# Patient Record
Sex: Female | Born: 1935 | Race: White | Hispanic: No | Smoking: Never smoker
Health system: Southern US, Community
[De-identification: ages and names within clinical notes are randomized; demographics above are authoritative.]

## PROBLEM LIST (undated history)

## (undated) DIAGNOSIS — E785 Hyperlipidemia, unspecified: Secondary | ICD-10-CM

## (undated) DIAGNOSIS — K219 Gastro-esophageal reflux disease without esophagitis: Secondary | ICD-10-CM

## (undated) DIAGNOSIS — E079 Disorder of thyroid, unspecified: Secondary | ICD-10-CM

## (undated) DIAGNOSIS — I509 Heart failure, unspecified: Secondary | ICD-10-CM

## (undated) DIAGNOSIS — I10 Essential (primary) hypertension: Secondary | ICD-10-CM

## (undated) DIAGNOSIS — F329 Major depressive disorder, single episode, unspecified: Secondary | ICD-10-CM

## (undated) DIAGNOSIS — F32A Depression, unspecified: Secondary | ICD-10-CM

## (undated) HISTORY — DX: Gastro-esophageal reflux disease without esophagitis: K21.9

## (undated) HISTORY — DX: Disorder of thyroid, unspecified: E07.9

## (undated) HISTORY — DX: Major depressive disorder, single episode, unspecified: F32.9

## (undated) HISTORY — DX: Depression, unspecified: F32.A

## (undated) HISTORY — PX: ANGIOPLASTY: SHX39

## (undated) HISTORY — PX: OTHER SURGICAL HISTORY: SHX169

## (undated) HISTORY — DX: Heart failure, unspecified: I50.9

## (undated) HISTORY — DX: Hyperlipidemia, unspecified: E78.5

## (undated) HISTORY — DX: Essential (primary) hypertension: I10

## (undated) HISTORY — PX: CRANIOTOMY: SHX93

---

## 1978-04-14 HISTORY — PX: ABDOMINAL HYSTERECTOMY: SHX81

## 1995-02-26 HISTORY — PX: MASTECTOMY: SHX3

## 2000-06-17 ENCOUNTER — Inpatient Hospital Stay (HOSPITAL_COMMUNITY): Admission: AD | Admit: 2000-06-17 | Discharge: 2000-06-21 | Payer: Self-pay | Admitting: Cardiology

## 2003-07-26 ENCOUNTER — Other Ambulatory Visit: Payer: Self-pay

## 2004-02-19 ENCOUNTER — Ambulatory Visit: Payer: Self-pay | Admitting: General Surgery

## 2004-04-18 ENCOUNTER — Ambulatory Visit: Payer: Self-pay | Admitting: Unknown Physician Specialty

## 2004-09-30 ENCOUNTER — Ambulatory Visit: Payer: Self-pay | Admitting: Unknown Physician Specialty

## 2005-02-17 ENCOUNTER — Ambulatory Visit: Payer: Self-pay | Admitting: General Surgery

## 2005-04-11 ENCOUNTER — Ambulatory Visit: Payer: Self-pay | Admitting: Ophthalmology

## 2005-04-21 ENCOUNTER — Ambulatory Visit: Payer: Medicare (Managed Care) | Admitting: Ophthalmology

## 2006-02-23 ENCOUNTER — Ambulatory Visit: Payer: Self-pay | Admitting: General Surgery

## 2007-03-15 ENCOUNTER — Ambulatory Visit: Payer: Self-pay | Admitting: General Surgery

## 2008-02-01 ENCOUNTER — Ambulatory Visit: Payer: Self-pay | Admitting: *Deleted

## 2008-05-10 ENCOUNTER — Ambulatory Visit: Payer: Self-pay | Admitting: Family Medicine

## 2008-09-16 ENCOUNTER — Inpatient Hospital Stay: Payer: Self-pay | Admitting: Internal Medicine

## 2008-09-19 LAB — HM COLONOSCOPY

## 2008-11-01 ENCOUNTER — Ambulatory Visit: Payer: Self-pay | Admitting: Gastroenterology

## 2008-11-05 ENCOUNTER — Emergency Department: Payer: Self-pay | Admitting: Emergency Medicine

## 2009-05-11 ENCOUNTER — Ambulatory Visit: Payer: Self-pay | Admitting: Family Medicine

## 2010-02-01 IMAGING — RF DG SMALL BOWEL
1 series · 9 of 9 positions shown · non-contrast
Comparison: none

REASON FOR EXAM: gi bleed
COMMENTS:

[Series 1: run · 9 of 9 slices shown]
[im 1/9]
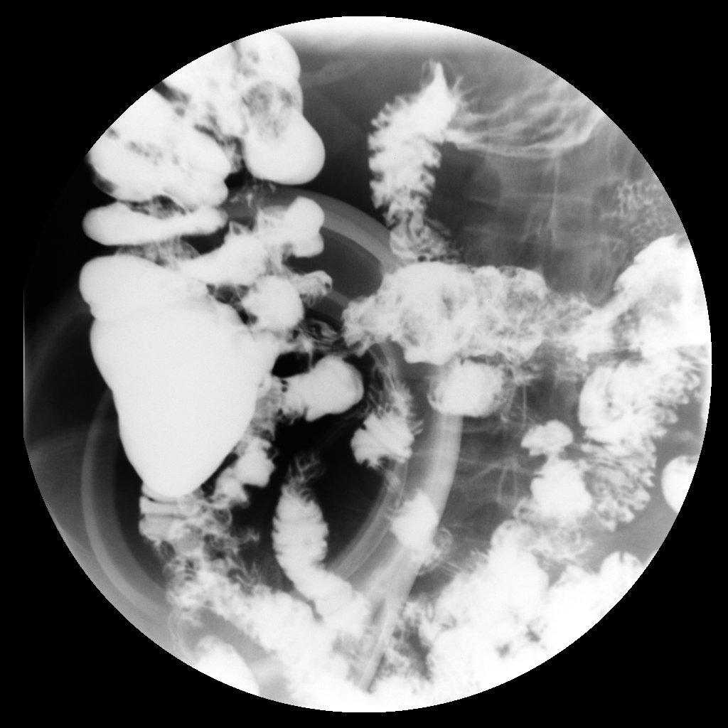
[im 2/9]
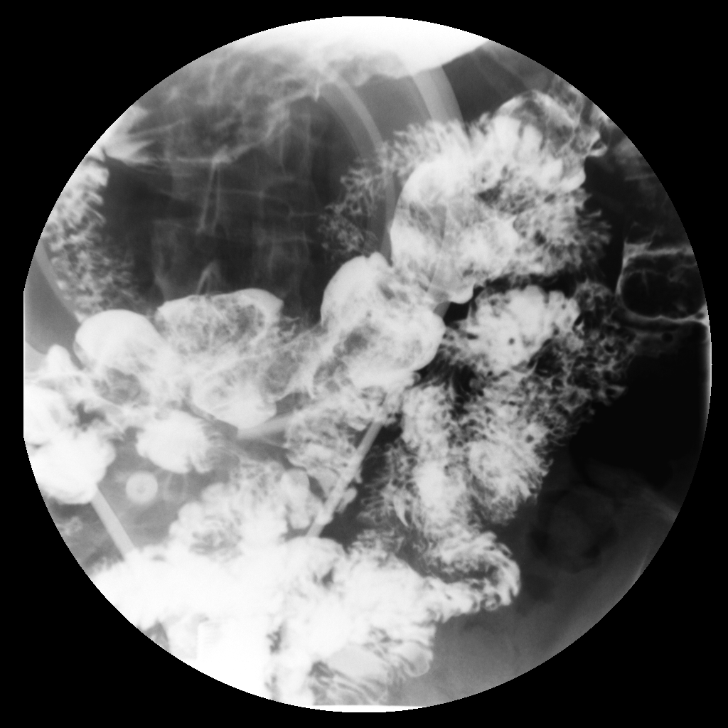
[im 3/9]
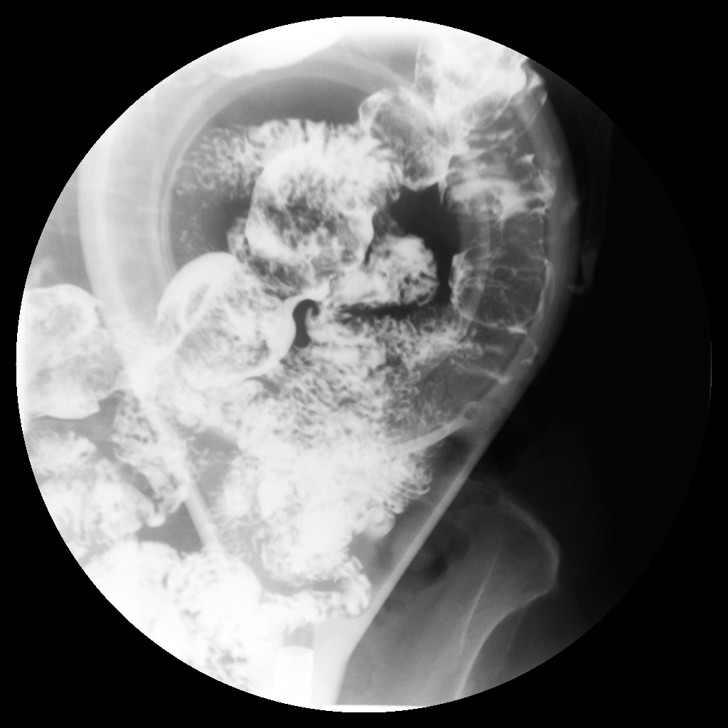
[im 4/9]
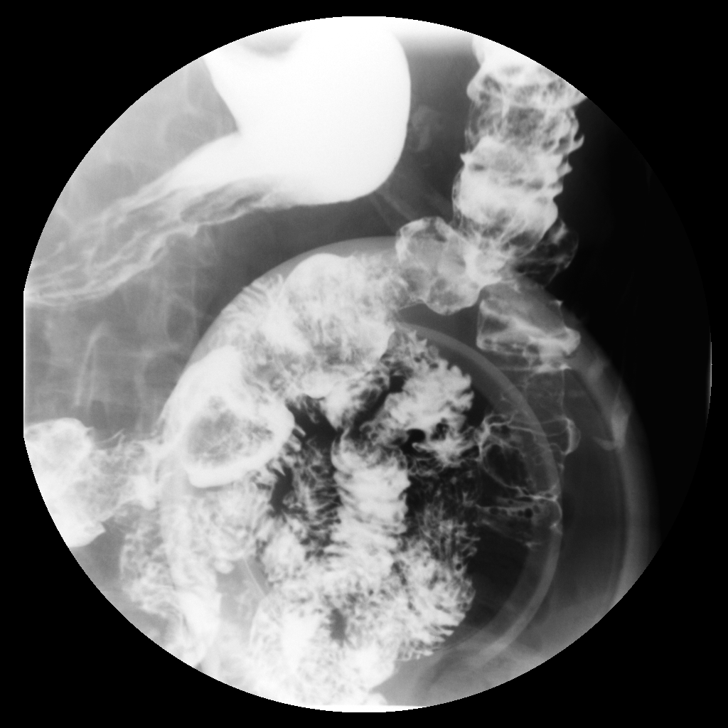
[im 5/9]
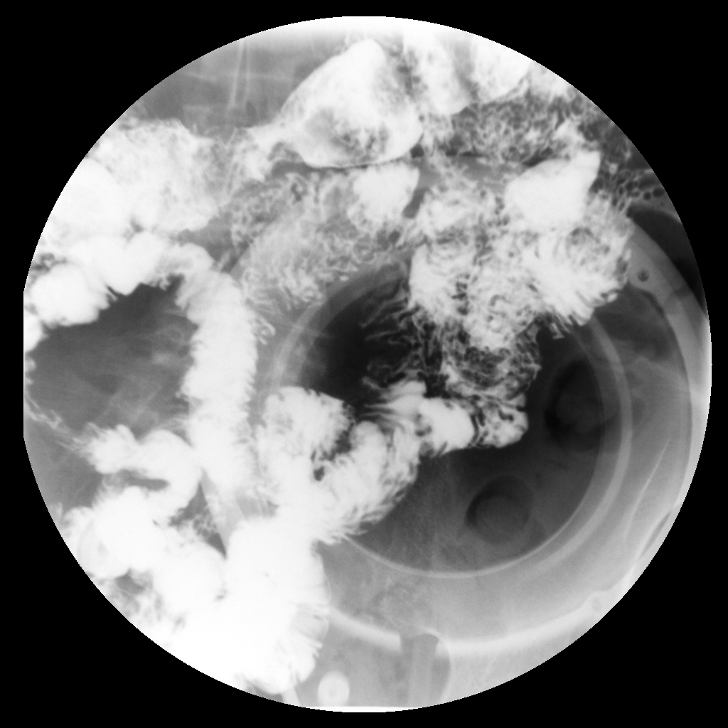
[im 6/9]
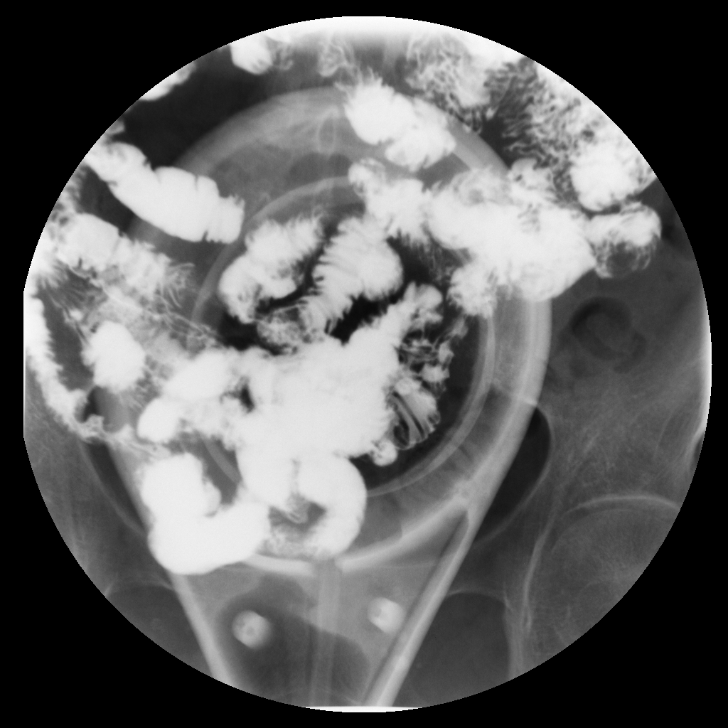
[im 7/9]
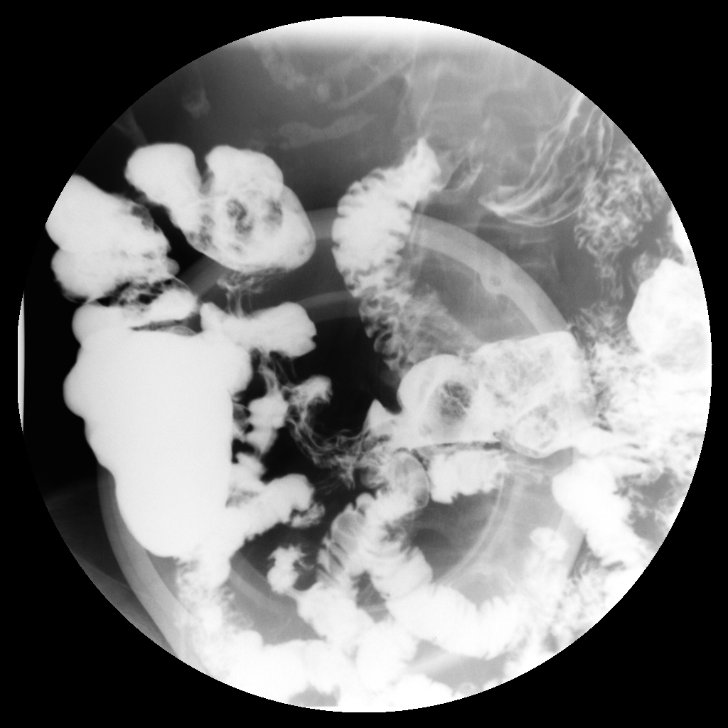
[im 8/9]
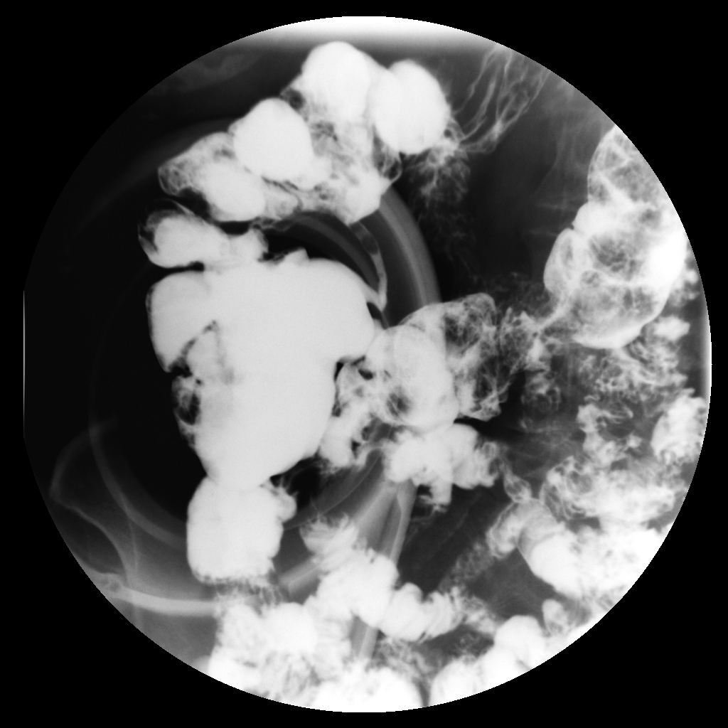
[im 9/9]
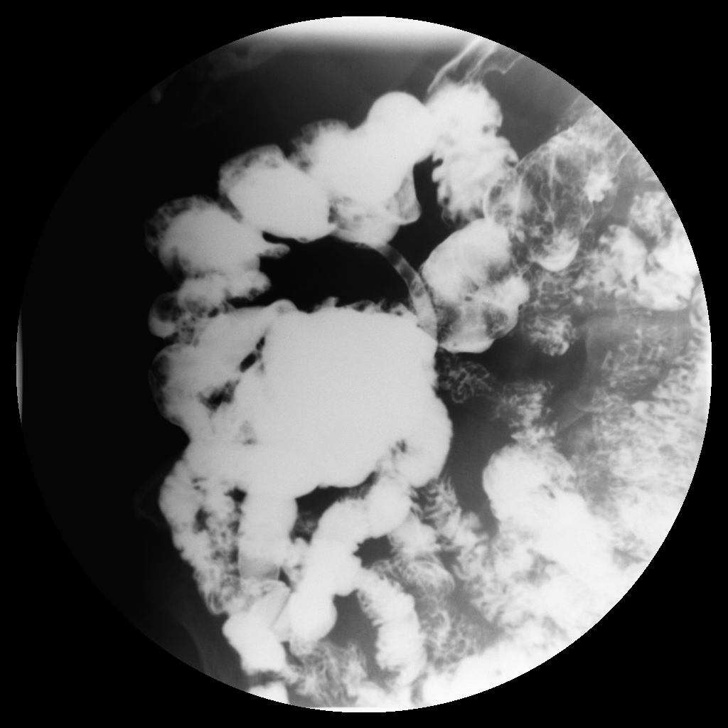

[9 of 9 positions shown; findings below may reference images not displayed]

PROCEDURE:     FL  - FL SMALL BOWEL  - November 01, 2008 [DATE]

RESULT:     The patient was given oral barium and the barium column was
followed through the small bowel.

The duodenum demonstrates appropriate rotation and placement of the ligament
of Treitz. Transit time to the terminal ileum is approximately 15 minutes.
Evaluation of the small bowel demonstrates no radiographic abnormalities.
The terminal ileum is partially appreciated and demonstrates no gross
radiographic abnormalities. Specifically, there is no evidence of filling
defects, focal out-pouchings or fusiform dilatation within the small bowel.
IMPRESSION: Unremarkable small bowel follow-through as described above.

## 2010-06-24 ENCOUNTER — Ambulatory Visit: Payer: Self-pay | Admitting: Family Medicine

## 2011-02-19 ENCOUNTER — Other Ambulatory Visit: Payer: Self-pay | Admitting: Family Medicine

## 2011-10-13 ENCOUNTER — Ambulatory Visit: Payer: Self-pay | Admitting: Family Medicine

## 2013-02-28 ENCOUNTER — Ambulatory Visit: Payer: Self-pay | Admitting: Family Medicine

## 2013-08-17 ENCOUNTER — Ambulatory Visit: Payer: Self-pay | Admitting: Family Medicine

## 2013-09-12 DIAGNOSIS — I219 Acute myocardial infarction, unspecified: Secondary | ICD-10-CM | POA: Insufficient documentation

## 2013-09-12 DIAGNOSIS — D496 Neoplasm of unspecified behavior of brain: Secondary | ICD-10-CM | POA: Insufficient documentation

## 2013-09-12 DIAGNOSIS — Z9889 Other specified postprocedural states: Secondary | ICD-10-CM | POA: Insufficient documentation

## 2013-11-17 LAB — HM DEXA SCAN

## 2013-11-18 LAB — CBC AND DIFFERENTIAL
HCT: 40 % (ref 36–46)
Hemoglobin: 12.9 g/dL (ref 12.0–16.0)
PLATELETS: 282 10*3/uL (ref 150–399)
WBC: 6.2 10*3/mL

## 2013-11-18 LAB — BASIC METABOLIC PANEL
BUN: 18 mg/dL (ref 4–21)
Creatinine: 1.1 mg/dL (ref 0.5–1.1)
Glucose: 91 mg/dL
POTASSIUM: 4.5 mmol/L (ref 3.4–5.3)
Sodium: 143 mmol/L (ref 137–147)

## 2013-11-18 LAB — LIPID PANEL
CHOLESTEROL: 176 mg/dL (ref 0–200)
HDL: 47 mg/dL (ref 35–70)
LDL Cholesterol: 92 mg/dL
TRIGLYCERIDES: 186 mg/dL — AB (ref 40–160)

## 2013-11-18 LAB — HEPATIC FUNCTION PANEL
ALT: 10 U/L (ref 7–35)
AST: 9 U/L — AB (ref 13–35)

## 2014-01-02 DIAGNOSIS — M810 Age-related osteoporosis without current pathological fracture: Secondary | ICD-10-CM | POA: Insufficient documentation

## 2014-05-25 ENCOUNTER — Ambulatory Visit: Payer: Self-pay | Admitting: Family Medicine

## 2014-05-25 LAB — HM MAMMOGRAPHY

## 2014-10-13 DIAGNOSIS — C50919 Malignant neoplasm of unspecified site of unspecified female breast: Secondary | ICD-10-CM | POA: Insufficient documentation

## 2014-10-13 DIAGNOSIS — F0390 Unspecified dementia without behavioral disturbance: Secondary | ICD-10-CM | POA: Insufficient documentation

## 2014-10-13 DIAGNOSIS — K219 Gastro-esophageal reflux disease without esophagitis: Secondary | ICD-10-CM | POA: Insufficient documentation

## 2014-10-13 DIAGNOSIS — N183 Chronic kidney disease, stage 3 unspecified: Secondary | ICD-10-CM | POA: Insufficient documentation

## 2014-10-13 DIAGNOSIS — K922 Gastrointestinal hemorrhage, unspecified: Secondary | ICD-10-CM | POA: Insufficient documentation

## 2014-10-13 DIAGNOSIS — I509 Heart failure, unspecified: Secondary | ICD-10-CM | POA: Insufficient documentation

## 2014-10-13 DIAGNOSIS — I10 Essential (primary) hypertension: Secondary | ICD-10-CM | POA: Insufficient documentation

## 2014-10-13 DIAGNOSIS — E538 Deficiency of other specified B group vitamins: Secondary | ICD-10-CM | POA: Insufficient documentation

## 2014-10-13 DIAGNOSIS — E559 Vitamin D deficiency, unspecified: Secondary | ICD-10-CM | POA: Insufficient documentation

## 2014-10-13 DIAGNOSIS — E876 Hypokalemia: Secondary | ICD-10-CM | POA: Insufficient documentation

## 2014-10-13 DIAGNOSIS — F32A Depression, unspecified: Secondary | ICD-10-CM | POA: Insufficient documentation

## 2014-10-13 DIAGNOSIS — K5790 Diverticulosis of intestine, part unspecified, without perforation or abscess without bleeding: Secondary | ICD-10-CM | POA: Insufficient documentation

## 2014-10-13 DIAGNOSIS — F329 Major depressive disorder, single episode, unspecified: Secondary | ICD-10-CM | POA: Insufficient documentation

## 2014-10-13 DIAGNOSIS — E7800 Pure hypercholesterolemia, unspecified: Secondary | ICD-10-CM | POA: Insufficient documentation

## 2014-10-13 DIAGNOSIS — R0902 Hypoxemia: Secondary | ICD-10-CM | POA: Insufficient documentation

## 2014-10-13 DIAGNOSIS — I2510 Atherosclerotic heart disease of native coronary artery without angina pectoris: Secondary | ICD-10-CM | POA: Insufficient documentation

## 2014-10-13 DIAGNOSIS — K449 Diaphragmatic hernia without obstruction or gangrene: Secondary | ICD-10-CM | POA: Insufficient documentation

## 2014-10-13 DIAGNOSIS — E039 Hypothyroidism, unspecified: Secondary | ICD-10-CM | POA: Insufficient documentation

## 2014-10-13 DIAGNOSIS — F419 Anxiety disorder, unspecified: Secondary | ICD-10-CM | POA: Insufficient documentation

## 2014-10-13 DIAGNOSIS — M810 Age-related osteoporosis without current pathological fracture: Secondary | ICD-10-CM | POA: Insufficient documentation

## 2014-11-07 ENCOUNTER — Ambulatory Visit: Payer: Self-pay | Admitting: Family Medicine

## 2014-11-07 ENCOUNTER — Other Ambulatory Visit: Payer: Self-pay

## 2014-11-07 DIAGNOSIS — E876 Hypokalemia: Secondary | ICD-10-CM

## 2014-11-07 MED ORDER — POTASSIUM CHLORIDE ER 10 MEQ PO CPCR: 10.0000 meq | ORAL_CAPSULE | Freq: Every day | ORAL | Status: DC

## 2014-11-20 ENCOUNTER — Ambulatory Visit (INDEPENDENT_AMBULATORY_CARE_PROVIDER_SITE_OTHER): Payer: Medicare Other | Admitting: Family Medicine

## 2014-11-20 ENCOUNTER — Encounter: Payer: Self-pay | Admitting: Family Medicine

## 2014-11-20 VITALS — BP 124/64 | HR 64 | Temp 98.0°F | Resp 16 | Wt 126.0 lb

## 2014-11-20 DIAGNOSIS — I10 Essential (primary) hypertension: Secondary | ICD-10-CM

## 2014-11-20 DIAGNOSIS — E559 Vitamin D deficiency, unspecified: Secondary | ICD-10-CM | POA: Diagnosis not present

## 2014-11-20 DIAGNOSIS — E039 Hypothyroidism, unspecified: Secondary | ICD-10-CM | POA: Diagnosis not present

## 2014-11-20 DIAGNOSIS — N183 Chronic kidney disease, stage 3 unspecified: Secondary | ICD-10-CM

## 2014-11-20 DIAGNOSIS — E538 Deficiency of other specified B group vitamins: Secondary | ICD-10-CM

## 2014-11-20 DIAGNOSIS — E7800 Pure hypercholesterolemia, unspecified: Secondary | ICD-10-CM

## 2014-11-20 NOTE — Progress Notes (Signed)
Subjective:    Patient ID: Holly Burns, female    DOB: Nov 05, 1924, 81 y.o.   MRN: [id number]  Hypertension This is a chronic problem. The problem is unchanged. The problem is controlled. Pertinent negatives include no anxiety, blurred vision, chest pain, headaches, malaise/fatigue, neck pain, palpitations, peripheral edema, PND, shortness of breath or sweats. Risk factors for coronary artery disease include dyslipidemia. There are no compliance problems.   Hyperlipidemia This is a chronic problem. The problem is controlled. Recent lipid tests were reviewed and are normal. There are no known factors aggravating her hyperlipidemia. Pertinent negatives include no chest pain, myalgias or shortness of breath. Current antihyperlipidemic treatment includes statins. There are no compliance problems.  Risk factors for coronary artery disease include hypertension and dyslipidemia.   Lab Results  Component Value Date   CHOL 176 11/18/2013   HDL 47 11/18/2013   LDLCALC 92 11/18/2013   TRIG 186* 11/18/2013     Hypothyroidism: Patient presents for evaluation of thyroid function. Symptoms consist of denies fatigue, weight changes, heat/cold intolerance, bowel/skin changes or CVS symptoms. The problem has been stable.  Previous thyroid studies include TSH. The hypothyroidism is due to hypothyroidism.        Patient Active Problem List   Diagnosis Date Noted  . Anxiety 10/13/2014  . B12 deficiency 10/13/2014  . Breast CA 10/13/2014  . Atherosclerosis of coronary artery 10/13/2014  . Chronic kidney disease (CKD), stage III (moderate) 10/13/2014  . CCF (congestive cardiac failure) 10/13/2014  . Clinical depression 10/13/2014  . DD (diverticular disease) 10/13/2014  . Bleeding gastrointestinal 10/13/2014  . Acid reflux 10/13/2014  . 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 Hypercholesteremia 10/13/2014  . BP (high blood pressure) 10/13/2014  . Decreased potassium in the blood 10/13/2014  .  Adult hypothyroidism 10/13/2014  . Hypoxia 10/13/2014  . OP (osteoporosis) 10/13/2014  . Dementia 10/13/2014  . Avitaminosis D 10/13/2014  . Involutional osteoporosis 01/02/2014  . Heart attack 09/12/2013  . H/O cardiac catheterization 09/12/2013  . Brain tumor 09/12/2013   Family History  Problem Relation Age of Onset  . Hypertension Mother   . Cerebrovascular Accident Mother   . Heart disease Father    History   Social History  . Marital Status: Widowed    Spouse Name: N/A  . Number of Children: 2  . Years of Education: H/S   Occupational History  . Retired    Social History Main Topics  . Smoking status: Never Smoker   . Smokeless tobacco: Never Used  . Alcohol Use: No  . Drug Use: No  . Sexual Activity: Not on file   Other Topics Concern  . Not on file   Social History Narrative   Past Surgical History  Procedure Laterality Date  . Angioplasty      due to MI LV ef-40%  . 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 Craniotomy    . Abdominal hysterectomy  1980  . Cataract surgery Left    Allergies  Allergen Reactions  . Codeine   . Penicillins   . Phenytoin Sodium Extended     Hyperthermia   Previous Medications   ACETAMINOPHEN (TYLENOL) 500 MG TABLET    Take by mouth.   AMLODIPINE (NORVASC) 5 MG TABLET    Take by mouth.   CALCIUM CARBONATE (OS-CAL) 600 MG TABS TABLET    Take by mouth.   CHOLECALCIFEROL (VITAMIN D HIGH POTENCY) 1000  UNITS CAPSULE    Take by mouth.   CRANBERRY-VITAMIN C-INULIN (UTI-STAT) LIQD    Take by mouth.   DONEPEZIL (ARICEPT) 5 MG TABLET    Take by mouth.   ESCITALOPRAM (LEXAPRO) 10 MG TABLET    Take by mouth.   FERROUS SULFATE (IRON) 325 (65 FE) MG TABS    Take by mouth.   FLUVASTATIN (LESCOL) 40 MG CAPSULE    Take by mouth.   FUROSEMIDE (LASIX) 20 MG TABLET    Take by mouth.   GLUCOSAMINE 500 MG CAPS    Take by mouth.   LEVOTHYROXINE (SYNTHROID) 75 MCG TABLET    Take  by mouth.   LORATADINE (CLARITIN) 10 MG CAPS    Take by mouth.   LORAZEPAM (ATIVAN) 1 MG TABLET    Take by mouth.   MELOXICAM (MOBIC) 7.5 MG TABLET    Take by mouth.   MEMANTINE (NAMENDA) 10 MG TABLET    Take by mouth.   METOPROLOL SUCCINATE (TOPROL-XL) 25 MG 24 HR TABLET    Take by mouth.   MULTIPLE VITAMIN PO    Take by mouth.   OMEPRAZOLE (PRILOSEC) 20 MG CAPSULE    Take by mouth.   POTASSIUM CHLORIDE (MICRO-K) 10 MEQ CR CAPSULE    Take 1 capsule (10 mEq total) by mouth daily.   RAMIPRIL (ALTACE) 10 MG CAPSULE    Take by mouth.   SIMVASTATIN (ZOCOR) 20 MG TABLET    Take by mouth.   VITAMIN C (ASCORBIC ACID) 500 MG TABLET    Take by mouth.      Review of Systems  Constitutional: Negative for fever, chills, malaise/fatigue, diaphoresis, activity change, appetite change, fatigue and unexpected weight change.  Eyes: Negative for blurred vision.  Respiratory: Negative for apnea, cough, choking, chest tightness, shortness of breath, wheezing and stridor.   Cardiovascular: Negative for chest pain, palpitations, leg swelling and PND.  Gastrointestinal: Negative for nausea, vomiting, abdominal pain, diarrhea, constipation, blood in stool, abdominal distention, anal bleeding and rectal pain.  Musculoskeletal: Positive for arthralgias (Has arthritis; pt reports Tylenol is working well.  ). Negative for myalgias, back pain, joint swelling, gait problem, neck pain and neck stiffness.  Neurological: Negative for dizziness, tremors, seizures, syncope, facial asymmetry, speech difficulty, weakness, light-headedness, numbness and headaches.       Objective:   Physical Exam  Constitutional: She is oriented to person, place, and time. She appears well-developed and well-nourished.  Cardiovascular: Normal rate and regular rhythm.   Pulmonary/Chest: Effort normal and breath sounds normal.  Neurological: She is alert and oriented to person, place, and time.  Skin: Skin is warm.    BP 124/64 mmHg   Pulse 64  Temp(Src) 98 F (36.7 C) (Oral)  Resp 16  Wt 126 lb (57.153 kg)      Assessment & Plan:  1. Essential hypertension Stable. Continue current medication. Check labs.  - CBC with Differential/Platelet - Comprehensive metabolic panel  2. B12 deficiency Will check if ordered by neurology.  If not will order at [facility].    3. Hypothyroidism, unspecified hypothyroidism type Stable. Check labs.  - TSH  4. Hypercholesteremia Condition is stable. Please continue current medication and  plan of care as noted.  Will check labs.   - Lipid panel  5. Avitaminosis D Stable. Continue medication.  6. Chronic kidney disease (CKD), stage III (moderate) Stable. Check labs.   Follow up for Wellness.    Margarita Rana, MD

## 2014-11-28 ENCOUNTER — Telehealth: Payer: Self-pay

## 2014-11-28 LAB — CBC WITH DIFFERENTIAL/PLATELET
BASOS: 0 %
Basophils Absolute: 0 10*3/uL (ref 0.0–0.2)
EOS (ABSOLUTE): 0.2 10*3/uL (ref 0.0–0.4)
Eos: 2 %
HEMATOCRIT: 39.6 % (ref 34.0–46.6)
Hemoglobin: 12.8 g/dL (ref 11.1–15.9)
Immature Grans (Abs): 0 10*3/uL (ref 0.0–0.1)
Immature Granulocytes: 0 %
Lymphocytes Absolute: 2.9 10*3/uL (ref 0.7–3.1)
Lymphs: 33 %
MCH: 30.8 pg (ref 26.6–33.0)
MCHC: 32.3 g/dL (ref 31.5–35.7)
MCV: 95 fL (ref 79–97)
MONOS ABS: 0.6 10*3/uL (ref 0.1–0.9)
Monocytes: 7 %
NEUTROS PCT: 58 %
Neutrophils Absolute: 5 10*3/uL (ref 1.4–7.0)
Platelets: 294 10*3/uL (ref 150–379)
RBC: 4.16 x10E6/uL (ref 3.77–5.28)
RDW: 13.4 % (ref 12.3–15.4)
WBC: 8.8 10*3/uL (ref 3.4–10.8)

## 2014-11-28 LAB — LIPID PANEL
Chol/HDL Ratio: 4 ratio units (ref 0.0–4.4)
Cholesterol, Total: 178 mg/dL (ref 100–199)
HDL: 44 mg/dL (ref >39)
LDL Calculated: 77 mg/dL (ref 0–99)
Triglycerides: 283 mg/dL — ABNORMAL HIGH (ref 0–149)
VLDL Cholesterol Cal: 57 mg/dL — ABNORMAL HIGH (ref 5–40)

## 2014-11-28 LAB — TSH: TSH: 2.45 u[IU]/mL (ref 0.450–4.500)

## 2014-11-28 LAB — COMPREHENSIVE METABOLIC PANEL
A/G RATIO: 2 (ref 1.1–2.5)
ALBUMIN: 4.3 g/dL (ref 3.5–4.7)
ALT: 12 IU/L (ref 0–32)
AST: 12 IU/L (ref 0–40)
Alkaline Phosphatase: 36 IU/L — ABNORMAL LOW (ref 39–117)
BILIRUBIN TOTAL: 0.6 mg/dL (ref 0.0–1.2)
BUN / CREAT RATIO: 22 (ref 11–26)
BUN: 28 mg/dL — ABNORMAL HIGH (ref 8–27)
CHLORIDE: 102 mmol/L (ref 97–108)
CO2: 26 mmol/L (ref 18–29)
Calcium: 9.5 mg/dL (ref 8.7–10.3)
Creatinine, Ser: 1.3 mg/dL — ABNORMAL HIGH (ref 0.57–1.00)
GFR calc non Af Amer: 37 mL/min/{1.73_m2} — ABNORMAL LOW (ref >59)
GFR, EST AFRICAN AMERICAN: 43 mL/min/{1.73_m2} — AB (ref >59)
Globulin, Total: 2.2 g/dL (ref 1.5–4.5)
Glucose: 90 mg/dL (ref 65–99)
POTASSIUM: 5 mmol/L (ref 3.5–5.2)
Sodium: 142 mmol/L (ref 134–144)
TOTAL PROTEIN: 6.5 g/dL (ref 6.0–8.5)

## 2014-11-28 NOTE — Telephone Encounter (Signed)
Left message to call back.  Thanks, 

## 2014-11-28 NOTE — Telephone Encounter (Signed)
-----   Message from Margarita Rana, MD sent at 11/28/2014  6:57 AM EDT ----- Labs stable. Please notify patient's son. Thanks.

## 2014-11-29 ENCOUNTER — Telehealth: Payer: Self-pay

## 2014-11-29 NOTE — Telephone Encounter (Signed)
Left detailed message as directed below, as requested for this phone number. Also to call if any questions or concerns.  Thanks,

## 2014-11-29 NOTE — Telephone Encounter (Signed)
Advised pt's son regarding the lab results. Holly Burns verbalized fully understanding.  Thanks,

## 2014-12-05 ENCOUNTER — Other Ambulatory Visit: Payer: Self-pay

## 2014-12-05 DIAGNOSIS — E039 Hypothyroidism, unspecified: Secondary | ICD-10-CM

## 2014-12-05 MED ORDER — LEVOTHYROXINE SODIUM 75 MCG PO TABS: 75.0000 ug | ORAL_TABLET | Freq: Every day | ORAL | Status: AC

## 2015-01-10 ENCOUNTER — Other Ambulatory Visit: Payer: Self-pay

## 2015-01-10 DIAGNOSIS — E7800 Pure hypercholesterolemia, unspecified: Secondary | ICD-10-CM

## 2015-01-10 MED ORDER — SIMVASTATIN 20 MG PO TABS: 20.0000 mg | ORAL_TABLET | Freq: Every day | ORAL | Status: AC

## 2015-01-29 ENCOUNTER — Other Ambulatory Visit: Payer: Self-pay

## 2015-01-29 DIAGNOSIS — I509 Heart failure, unspecified: Secondary | ICD-10-CM

## 2015-01-29 MED ORDER — FUROSEMIDE 20 MG PO TABS: 20.0000 mg | ORAL_TABLET | Freq: Two times a day (BID) | ORAL | Status: DC

## 2015-03-02 ENCOUNTER — Other Ambulatory Visit: Payer: Self-pay | Admitting: Family Medicine

## 2015-03-02 ENCOUNTER — Telehealth: Payer: Self-pay | Admitting: Family Medicine

## 2015-03-02 NOTE — Telephone Encounter (Signed)
Pt's son [name] stopped by office requesting refills for LORazepam (ATIVAN) 1 MG tablet, metoprolol succinate (TOPROL-XL) 25 MG 24 hr tablet ,ramipril (ALTACE) 10 MG capsule be called into [facility].Call back # [phone]

## 2015-03-03 MED ORDER — RAMIPRIL 10 MG PO CAPS: 10.0000 mg | ORAL_CAPSULE | Freq: Every day | ORAL | Status: DC

## 2015-03-03 MED ORDER — LORAZEPAM 1 MG PO TABS: 1.0000 mg | ORAL_TABLET | ORAL | Status: DC | PRN

## 2015-03-03 MED ORDER — METOPROLOL SUCCINATE ER 25 MG PO TB24: 25.0000 mg | ORAL_TABLET | Freq: Every day | ORAL | Status: AC

## 2015-03-03 NOTE — Telephone Encounter (Signed)
RX called in-aa 

## 2015-03-03 NOTE — Telephone Encounter (Signed)
Please call in Lorazepam to [facility]. Thanks.

## 2015-03-12 DIAGNOSIS — M818 Other osteoporosis without current pathological fracture: Secondary | ICD-10-CM | POA: Insufficient documentation

## 2015-03-19 ENCOUNTER — Ambulatory Visit (INDEPENDENT_AMBULATORY_CARE_PROVIDER_SITE_OTHER): Payer: Medicare Other | Admitting: Family Medicine

## 2015-03-19 ENCOUNTER — Encounter: Payer: Self-pay | Admitting: Family Medicine

## 2015-03-19 VITALS — BP 126/74 | HR 72 | Temp 98.8°F | Resp 16 | Wt 123.0 lb

## 2015-03-19 DIAGNOSIS — Z0000 Encounter for general adult medical examination without abnormal findings: Secondary | ICD-10-CM

## 2015-03-19 NOTE — Progress Notes (Signed)
Patient ID: Holly Burns, female   DOB: May 26, 1924, 81 y.o.   MRN: [id number]         Patient: Holly Burns, Female    DOB: Jan 06, 1925, 81 y.o.   MRN: [id number] Visit Date: 03/19/2015  Today's Provider: Margarita Rana, MD   Chief Complaint  Patient presents with  . Annual Exam   Subjective:    Annual wellness visit Holly Burns is a 81 y.o. female. She feels well. She reports not exercising much but stays active. She reports she is sleeping well. Her with her son. Chronic problems stable.  Had Reclast injection several weeks ago.   -----------------------------------------------------------   Review of Systems  Constitutional: Negative.   HENT: Negative.   Eyes: Negative.   Respiratory: Negative.   Cardiovascular: Negative.   Gastrointestinal: Negative.   Endocrine: Negative.   Genitourinary: Negative.   Musculoskeletal: Negative.   Skin: Negative.   Allergic/Immunologic: Negative.   Neurological: Negative.   Hematological: Negative.   Psychiatric/Behavioral: Negative.     Social History   Social History  . Marital Status: Widowed    Spouse Name: N/A  . Number of Children: 2  . Years of Education: H/S   Occupational History  . Retired    Social History Main Topics  . Smoking status: Never Smoker   . Smokeless tobacco: Never Used  . Alcohol Use: No  . Drug Use: No  . Sexual Activity: Not on file   Other Topics Concern  . Not on file   Social History Narrative    Patient Active Problem List   Diagnosis Date Noted  . Adult idiopathic generalized osteoporosis 03/12/2015  . Anxiety 10/13/2014  . B12 deficiency 10/13/2014  . Breast CA ([place]) 10/13/2014  . Atherosclerosis of coronary artery 10/13/2014  . Chronic kidney disease (CKD), stage III (moderate) 10/13/2014  . CCF (congestive cardiac failure) ([place]) 10/13/2014  . Clinical depression 10/13/2014  . DD (diverticular disease) 10/13/2014  . Bleeding gastrointestinal 10/13/2014    . Acid reflux 10/13/2014  . 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 Hypercholesteremia 10/13/2014  . BP (high blood pressure) 10/13/2014  . Decreased potassium in the blood 10/13/2014  . Adult hypothyroidism 10/13/2014  . Hypoxia 10/13/2014  . OP (osteoporosis) 10/13/2014  . Dementia 10/13/2014  . Avitaminosis D 10/13/2014  . Involutional osteoporosis 01/02/2014  . Heart attack ([place]) 09/12/2013  . H/O cardiac catheterization 09/12/2013  . Brain tumor ([place]) 09/12/2013    Past Surgical History  Procedure Laterality Date  . Angioplasty      due to MI LV ef-40%  . 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 Craniotomy    . Abdominal hysterectomy  1980  . Cataract surgery Left     Her family history includes Cerebrovascular Accident in her mother; Heart disease in her father; Hypertension in her mother.    Previous Medications   ACETAMINOPHEN (TYLENOL) 500 MG TABLET    Take by mouth.   AMLODIPINE (NORVASC) 5 MG TABLET    Take by mouth.   CALCIUM CARBONATE (OS-CAL) 600 MG TABS TABLET    Take by mouth.   CHOLECALCIFEROL (VITAMIN D HIGH POTENCY) 1000 UNITS CAPSULE    Take by mouth.   CRANBERRY-VITAMIN C-INULIN (UTI-STAT) LIQD    Take by mouth.   DONEPEZIL (ARICEPT) 5 MG TABLET    Take by mouth.   ESCITALOPRAM (LEXAPRO) 10 MG TABLET    Take by mouth.  FERROUS SULFATE (IRON) 325 (65 FE) MG TABS    Take by mouth.   FLUVASTATIN (LESCOL) 40 MG CAPSULE    Take by mouth.   FUROSEMIDE (LASIX) 20 MG TABLET    Take 1 tablet (20 mg total) by mouth 2 (two) times daily.   GLUCOSAMINE 500 MG CAPS    Take by mouth.   LEVOTHYROXINE (SYNTHROID) 75 MCG TABLET    Take 1 tablet (75 mcg total) by mouth daily before breakfast.   LORATADINE (CLARITIN) 10 MG CAPS    Take by mouth.   LORAZEPAM (ATIVAN) 1 MG TABLET    Take 1 tablet (1 mg total) by mouth every 4 (four) hours as needed for anxiety.   MELOXICAM (MOBIC) 7.5 MG TABLET    Take by mouth.    MEMANTINE (NAMENDA) 10 MG TABLET    Take by mouth.   METOPROLOL SUCCINATE (TOPROL-XL) 25 MG 24 HR TABLET    TAKE 1 TABLET DAILY.   METOPROLOL SUCCINATE (TOPROL-XL) 25 MG 24 HR TABLET    Take 1 tablet (25 mg total) by mouth daily.   MULTIPLE VITAMIN PO    Take by mouth.   OMEPRAZOLE (PRILOSEC) 20 MG CAPSULE    Take by mouth.   POTASSIUM CHLORIDE (MICRO-K) 10 MEQ CR CAPSULE    Take 1 capsule (10 mEq total) by mouth daily.   RAMIPRIL (ALTACE) 10 MG CAPSULE    1 capsule Capsule, Oral, daily   RAMIPRIL (ALTACE) 10 MG CAPSULE    Take 1 capsule (10 mg total) by mouth daily.   SIMVASTATIN (ZOCOR) 20 MG TABLET    Take 1 tablet (20 mg total) by mouth at bedtime.   VITAMIN C (ASCORBIC ACID) 500 MG TABLET    Take by mouth.    Patient Care Team: Margarita Rana, MD as PCP - General (Family Medicine)     Objective:   Vitals: BP 126/74 mmHg  Pulse 72  Temp(Src) 98.8 F (37.1 C) (Oral)  Resp 16  Wt 123 lb (55.792 kg)  Physical Exam  Constitutional: She is oriented to person, place, and time. She appears well-developed and well-nourished.  HENT:  Head: Normocephalic and atraumatic.  Right Ear: Tympanic membrane, external ear and ear canal normal.  Left Ear: Tympanic membrane, external ear and ear canal normal.  Nose: Nose normal.  Eyes: Lids are normal. Lids are everted and swept, no foreign bodies found.  Neck: Carotid bruit is not present.  Cardiovascular: Normal rate, regular rhythm and normal heart sounds.   Pulmonary/Chest: Effort normal and breath sounds normal.  Abdominal: Soft. Normal appearance, normal aorta and bowel sounds are normal. There is no tenderness.  Musculoskeletal: Normal range of motion.  Neurological: She is alert and oriented to person, place, and time.  Skin: Skin is warm, dry and intact.  Psychiatric: She has a normal mood and affect. Her speech is normal and behavior is normal. Judgment and thought content normal. Cognition and memory are normal.    Activities of  Daily Living In your present state of health, do you have any difficulty performing the following activities: 11/20/2014  Hearing? N  Vision? N  Difficulty concentrating or making decisions? Y  Walking or climbing stairs? N  Dressing or bathing? N  Doing errands, shopping? Y    Fall Risk Assessment Fall Risk  03/19/2015 11/20/2014  Falls in the past year? No No     Depression Screen PHQ 2/9 Scores 03/19/2015 11/20/2014  PHQ - 2 Score 0 0    Cognitive  Testing - 6-CIT  Correct? Score   What year is it? yes 0 0 or 4  What month is it? yes 0 0 or 3  Memorize:    Pia Mau,  44,  [address],  [place],      What time is it? (within 1 hour) yes 0 0 or 3  Count backwards from 20 no 2 0, 2, or 4  Name the months of the year yes 4 0, 2, or 4  Repeat name & address above yes 10 0, 2, 4, 6, 8, or 10       TOTAL SCORE  16/28   Interpretation:  Abnormal- History of Dementia  Normal (0-7) Abnormal (8-28)       Assessment & Plan:     Annual Wellness Visit  Reviewed patient's Family Medical History Reviewed and updated list of patient's medical providers Assessment of cognitive impairment was done Assessed patient's functional ability Established a written schedule for health screening [place] Completed and Reviewed  Exercise Activities and Dietary recommendations Goals    None      Immunization History  Administered Date(s) Administered  . Influenza-Unspecified 02/21/2015  . Pneumococcal Conjugate-13 07/07/2013  . Pneumococcal Polysaccharide-23 02/17/1995  . Td 01/23/1998  . Tdap 07/07/2013    Health Maintenance  Topic Date Due  . ZOSTAVAX  03/22/1987  . INFLUENZA VACCINE  11/13/2015  . TETANUS/TDAP  07/08/2023  . DEXA SCAN  Completed  . PNA vac Low Risk Adult  Completed      Discussed health benefits of physical activity, and encouraged her to engage in regular exercise appropriate for her age and condition.   1. Medicare annual wellness  visit, subsequent Stable. Chronic problems stable. Recheck in 6 months. Continue current medication and plan of care.    Patient was seen and examined by Jerrell Belfast, MD, and note scribed by Ashley Royalty, CMA.  I have reviewed the document for accuracy and completeness and I agree with above. Jerrell Belfast, MD   Margarita Rana, MD    ------------------------------------------------------------------------------------------------------------

## 2015-03-29 ENCOUNTER — Emergency Department: Payer: Medicare Other

## 2015-03-29 ENCOUNTER — Inpatient Hospital Stay
Admission: EM | Admit: 2015-03-29 | Discharge: 2015-04-02 | DRG: 309 | Disposition: A | Discharge status: Home / Self Care | Payer: Medicare Other | Attending: Specialist | Admitting: Specialist

## 2015-03-29 DIAGNOSIS — F0390 Unspecified dementia without behavioral disturbance: Secondary | ICD-10-CM | POA: Diagnosis present

## 2015-03-29 DIAGNOSIS — I5022 Chronic systolic (congestive) heart failure: Secondary | ICD-10-CM | POA: Diagnosis present

## 2015-03-29 DIAGNOSIS — N189 Chronic kidney disease, unspecified: Secondary | ICD-10-CM | POA: Diagnosis present

## 2015-03-29 DIAGNOSIS — E782 Mixed hyperlipidemia: Secondary | ICD-10-CM | POA: Diagnosis present

## 2015-03-29 DIAGNOSIS — Z880 Allergy status to penicillin: Secondary | ICD-10-CM

## 2015-03-29 DIAGNOSIS — K219 Gastro-esophageal reflux disease without esophagitis: Secondary | ICD-10-CM | POA: Diagnosis present

## 2015-03-29 DIAGNOSIS — Z79899 Other long term (current) drug therapy: Secondary | ICD-10-CM

## 2015-03-29 DIAGNOSIS — Z9012 Acquired absence of left breast and nipple: Secondary | ICD-10-CM

## 2015-03-29 DIAGNOSIS — E039 Hypothyroidism, unspecified: Secondary | ICD-10-CM | POA: Diagnosis present

## 2015-03-29 DIAGNOSIS — I4891 Unspecified atrial fibrillation: Secondary | ICD-10-CM | POA: Diagnosis not present

## 2015-03-29 DIAGNOSIS — R7989 Other specified abnormal findings of blood chemistry: Secondary | ICD-10-CM

## 2015-03-29 DIAGNOSIS — I248 Other forms of acute ischemic heart disease: Secondary | ICD-10-CM | POA: Diagnosis present

## 2015-03-29 DIAGNOSIS — Z90710 Acquired absence of both cervix and uterus: Secondary | ICD-10-CM

## 2015-03-29 DIAGNOSIS — J40 Bronchitis, not specified as acute or chronic: Secondary | ICD-10-CM | POA: Diagnosis not present

## 2015-03-29 DIAGNOSIS — I2510 Atherosclerotic heart disease of native coronary artery without angina pectoris: Secondary | ICD-10-CM | POA: Diagnosis present

## 2015-03-29 DIAGNOSIS — I129 Hypertensive chronic kidney disease with stage 1 through stage 4 chronic kidney disease, or unspecified chronic kidney disease: Secondary | ICD-10-CM | POA: Diagnosis present

## 2015-03-29 DIAGNOSIS — Z9861 Coronary angioplasty status: Secondary | ICD-10-CM

## 2015-03-29 DIAGNOSIS — F329 Major depressive disorder, single episode, unspecified: Secondary | ICD-10-CM | POA: Diagnosis present

## 2015-03-29 DIAGNOSIS — E876 Hypokalemia: Secondary | ICD-10-CM | POA: Diagnosis present

## 2015-03-29 DIAGNOSIS — Z8249 Family history of ischemic heart disease and other diseases of the circulatory system: Secondary | ICD-10-CM

## 2015-03-29 DIAGNOSIS — R778 Other specified abnormalities of plasma proteins: Secondary | ICD-10-CM

## 2015-03-29 DIAGNOSIS — N184 Chronic kidney disease, stage 4 (severe): Secondary | ICD-10-CM | POA: Diagnosis present

## 2015-03-29 DIAGNOSIS — Z886 Allergy status to analgesic agent status: Secondary | ICD-10-CM

## 2015-03-29 DIAGNOSIS — Z823 Family history of stroke: Secondary | ICD-10-CM

## 2015-03-29 DIAGNOSIS — J209 Acute bronchitis, unspecified: Secondary | ICD-10-CM | POA: Diagnosis present

## 2015-03-29 DIAGNOSIS — J45909 Unspecified asthma, uncomplicated: Secondary | ICD-10-CM | POA: Diagnosis present

## 2015-03-29 DIAGNOSIS — I130 Hypertensive heart and chronic kidney disease with heart failure and stage 1 through stage 4 chronic kidney disease, or unspecified chronic kidney disease: Secondary | ICD-10-CM | POA: Diagnosis present

## 2015-03-29 DIAGNOSIS — N179 Acute kidney failure, unspecified: Secondary | ICD-10-CM

## 2015-03-29 DIAGNOSIS — Z9842 Cataract extraction status, left eye: Secondary | ICD-10-CM

## 2015-03-29 DIAGNOSIS — Z888 Allergy status to other drugs, medicaments and biological substances status: Secondary | ICD-10-CM

## 2015-03-29 LAB — CBC
HEMATOCRIT: 40.2 % (ref 35.0–47.0)
HEMOGLOBIN: 12.9 g/dL (ref 12.0–16.0)
MCH: 31 pg (ref 26.0–34.0)
MCHC: 32.1 g/dL (ref 32.0–36.0)
MCV: 96.6 fL (ref 80.0–100.0)
Platelets: 300 10*3/uL (ref 150–440)
RBC: 4.16 MIL/uL (ref 3.80–5.20)
RDW: 13.5 % (ref 11.5–14.5)
WBC: 14 10*3/uL — ABNORMAL HIGH (ref 3.6–11.0)

## 2015-03-29 LAB — COMPREHENSIVE METABOLIC PANEL
ALBUMIN: 3.9 g/dL (ref 3.5–5.0)
ALK PHOS: 76 U/L (ref 38–126)
ALT: 45 U/L (ref 14–54)
AST: 34 U/L (ref 15–41)
Anion gap: 13 (ref 5–15)
BILIRUBIN TOTAL: 1.1 mg/dL (ref 0.3–1.2)
BUN: 33 mg/dL — AB (ref 6–20)
CALCIUM: 9 mg/dL (ref 8.9–10.3)
CO2: 23 mmol/L (ref 22–32)
CREATININE: 2.01 mg/dL — AB (ref 0.44–1.00)
Chloride: 107 mmol/L (ref 101–111)
GFR calc Af Amer: 24 mL/min — ABNORMAL LOW (ref >60)
GFR calc non Af Amer: 21 mL/min — ABNORMAL LOW (ref >60)
GLUCOSE: 191 mg/dL — AB (ref 65–99)
Potassium: 3.6 mmol/L (ref 3.5–5.1)
SODIUM: 143 mmol/L (ref 135–145)
TOTAL PROTEIN: 7.5 g/dL (ref 6.5–8.1)

## 2015-03-29 LAB — TROPONIN I: Troponin I: 0.21 ng/mL — ABNORMAL HIGH (ref <0.031)

## 2015-03-29 MED ORDER — DILTIAZEM HCL 100 MG IV SOLR
5.0000 mg/h | Freq: Once | INTRAVENOUS | Status: AC
  Administered 2015-03-29: 5 mg/h via INTRAVENOUS
  Filled 2015-03-29: qty 100

## 2015-03-29 MED ORDER — DILTIAZEM HCL 25 MG/5ML IV SOLN
10.0000 mg | Freq: Once | INTRAVENOUS | Status: AC
  Administered 2015-03-29: 10 mg via INTRAVENOUS
  Filled 2015-03-29: qty 5

## 2015-03-29 MED ORDER — SODIUM CHLORIDE 0.9 % IV SOLN
1000.0000 mL | Freq: Once | INTRAVENOUS | Status: AC
  Administered 2015-03-29: 1000 mL via INTRAVENOUS

## 2015-03-29 MED ORDER — AZITHROMYCIN 500 MG IV SOLR
500.0000 mg | Freq: Once | INTRAVENOUS | Status: AC
  Administered 2015-03-29: 500 mg via INTRAVENOUS
  Filled 2015-03-29: qty 500

## 2015-03-29 NOTE — ED Notes (Signed)
Pt in with co cold symptoms for few days, has also forgotten to take her meds since yest.  Pt has hx of dementia, co chills and diaphoresis at times.  Pt has no co pain at this time and no distress noted.

## 2015-03-29 NOTE — ED Notes (Signed)
Patient placed on bedpan. Unable to urinate at this time.

## 2015-03-29 NOTE — ED Notes (Signed)
Pharmacy called about Cardizem infusion. States they will send it up.

## 2015-03-29 NOTE — ED Provider Notes (Signed)
[facility] Emergency Department Provider Note  ____________________________________________  Time seen: On arrival  I have reviewed the triage vital signs and the nursing notes.   HISTORY  Chief Complaint Chills  cough   HPI TANERA BAUKNIGHT is a 81 y.o. female who presents with complaints of moderate productive cough for approximately one week. She denies shortness of breath but has had chills intermittently. She denies fevers. She denies injury. Reported history of dementia and has forgotten take her medications. No sick contacts. No recent hospitalization.No recent travel     Past Medical History  Diagnosis Date  . CHF (congestive heart failure) ([place])   . Depression   . GERD (gastroesophageal reflux disease)   . Hyperlipidemia   . Hypertension   . Thyroid disease     Patient Active Problem List   Diagnosis Date Noted  . Adult idiopathic generalized osteoporosis 03/12/2015  . Anxiety 10/13/2014  . B12 deficiency 10/13/2014  . Breast CA ([place]) 10/13/2014  . Atherosclerosis of coronary artery 10/13/2014  . Chronic kidney disease (CKD), stage III (moderate) 10/13/2014  . CCF (congestive cardiac failure) ([place]) 10/13/2014  . Clinical depression 10/13/2014  . DD (diverticular disease) 10/13/2014  . Bleeding gastrointestinal 10/13/2014  . Acid reflux 10/13/2014  . 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 Hypercholesteremia 10/13/2014  . BP (high blood pressure) 10/13/2014  . Decreased potassium in the blood 10/13/2014  . Adult hypothyroidism 10/13/2014  . Hypoxia 10/13/2014  . OP (osteoporosis) 10/13/2014  . Dementia 10/13/2014  . Avitaminosis D 10/13/2014  . Involutional osteoporosis 01/02/2014  . Heart attack ([place]) 09/12/2013  . H/O cardiac catheterization 09/12/2013  . Brain tumor ([place]) 09/12/2013    Past Surgical History  Procedure Laterality Date  . Angioplasty      due to MI LV ef-40%  . Mastectomy Left 02/26/1995    due to a tumor  which was apparently benign, no chemo or radiation   . Craniotomy    . Abdominal hysterectomy  1980  . Cataract surgery Left     Current Outpatient Rx  Name  Route  Sig  Dispense  Refill  . acetaminophen (TYLENOL) 500 MG tablet   Oral   Take by mouth.         Marland Kitchen amLODipine (NORVASC) 5 MG tablet   Oral   Take by mouth.         . calcium carbonate (OS-CAL) 600 MG TABS tablet   Oral   Take by mouth.         . Cholecalciferol (VITAMIN D HIGH POTENCY) 1000 UNITS capsule   Oral   Take by mouth.         . Cranberry-Vitamin C-Inulin (UTI-STAT) LIQD   Oral   Take by mouth.         . donepezil (ARICEPT) 5 MG tablet   Oral   Take by mouth.         . escitalopram (LEXAPRO) 10 MG tablet   Oral   Take by mouth.         . Ferrous Sulfate (IRON) 325 (65 FE) MG TABS   Oral   Take by mouth.         . fluvastatin (LESCOL) 40 MG capsule   Oral   Take by mouth.         . furosemide (LASIX) 20 MG tablet   Oral   Take 1 tablet (20 mg total) by mouth 2 (two) times daily.   180 tablet   3   .  Glucosamine 500 MG CAPS   Oral   Take by mouth.         . levothyroxine (SYNTHROID) 75 MCG tablet   Oral   Take 1 tablet (75 mcg total) by mouth daily before breakfast.   90 tablet   3   . Loratadine (CLARITIN) 10 MG CAPS   Oral   Take by mouth.         Marland Kitchen LORazepam (ATIVAN) 1 MG tablet   Oral   Take 1 tablet (1 mg total) by mouth every 4 (four) hours as needed for anxiety.   180 tablet   1   . meloxicam (MOBIC) 7.5 MG tablet   Oral   Take by mouth.         . memantine (NAMENDA) 10 MG tablet   Oral   Take by mouth.         . metoprolol succinate (TOPROL-XL) 25 MG 24 hr tablet      TAKE 1 TABLET DAILY.   90 tablet   0   . metoprolol succinate (TOPROL-XL) 25 MG 24 hr tablet   Oral   Take 1 tablet (25 mg total) by mouth daily.   90 tablet   3   . MULTIPLE VITAMIN PO   Oral   Take by mouth.         Marland Kitchen omeprazole (PRILOSEC) 20 MG capsule    Oral   Take by mouth.         . potassium chloride (MICRO-K) 10 MEQ CR capsule   Oral   Take 1 capsule (10 mEq total) by mouth daily.   90 capsule   3   . ramipril (ALTACE) 10 MG capsule      1 capsule Capsule, Oral, daily   90 capsule   0   . ramipril (ALTACE) 10 MG capsule   Oral   Take 1 capsule (10 mg total) by mouth daily.   90 capsule   3   . simvastatin (ZOCOR) 20 MG tablet   Oral   Take 1 tablet (20 mg total) by mouth at bedtime.   90 tablet   3   . vitamin C (ASCORBIC ACID) 500 MG tablet   Oral   Take by mouth.           Allergies Codeine; Penicillins; and Phenytoin sodium extended  Family History  Problem Relation Age of Onset  . Hypertension Mother   . Cerebrovascular Accident Mother   . Heart disease Father     Social History Social History  Substance Use Topics  . Smoking status: Never Smoker   . Smokeless tobacco: Never Used  . Alcohol Use: No    Review of Systems  Constitutional: Negative for fever. Positive for chills Eyes: Negative for visual changes. ENT: Negative for sore throat Cardiovascular: Negative for chest pain. Respiratory: Negative for shortness of breath. Positive for cough Gastrointestinal: Negative for abdominal pain, vomiting and diarrhea. Genitourinary: Negative for dysuria. Musculoskeletal: Negative for back pain. Skin: Negative for rash. Neurological: Negative for headaches  Psychiatric: No anxiety    ____________________________________________   PHYSICAL EXAM:  VITAL SIGNS: ED Triage Vitals  Enc Vitals Group     BP 03/29/15 2105 147/60 mmHg     Pulse Rate 03/29/15 2105 137     Resp 03/29/15 2105 20     Temp 03/29/15 2105 99.2 F (37.3 C)     Temp Source 03/29/15 2105 Oral     SpO2 03/29/15 2105 93 %  Weight 03/29/15 2105 128 lb (58.06 kg)     Height --      Head Cir --      Peak Flow --      Pain Score 03/29/15 2123 0     Pain Loc --      Pain Edu? --      Excl. in [place]? --       Constitutional: Alert and oriented. Well appearing and in no distress. Eyes: Conjunctivae are normal.  ENT   Head: Normocephalic and atraumatic.   Mouth/Throat: Mucous membranes are moist. Cardiovascular: Tachycardia, irregularly irregular rhythm. Normal and symmetric distal pulses are present in all extremities. No murmurs, rubs, or gallops. Respiratory: Normal respiratory effort without tachypnea nor retractions. Breath sounds are clear and equal bilaterally.  Gastrointestinal: Soft and non-tender in all quadrants. No distention. There is no CVA tenderness. Genitourinary: deferred Musculoskeletal: Nontender with normal range of motion in all extremities. No lower extremity tenderness nor edema. Neurologic:  Normal speech and language. No gross focal neurologic deficits are appreciated. Skin:  Skin is warm, dry and intact. No rash noted. Psychiatric: Mood and affect are normal. Patient exhibits appropriate insight and judgment.  ____________________________________________    LABS (pertinent positives/negatives)  Labs Reviewed  CBC - Abnormal; Notable for the following:    WBC 14.0 (*)    All other components within normal limits  COMPREHENSIVE METABOLIC PANEL - Abnormal; Notable for the following:    Glucose, Bld 191 (*)    BUN 33 (*)    Creatinine, Ser 2.01 (*)    GFR calc non Af Amer 21 (*)    GFR calc Af Amer 24 (*)    All other components within normal limits  TROPONIN I - Abnormal; Notable for the following:    Troponin I 0.21 (*)    All other components within normal limits  URINALYSIS COMPLETEWITH MICROSCOPIC ([facility] ONLY)    ____________________________________________   EKG  ED ECG REPORT I, Lavonia Drafts, the attending physician, personally viewed and interpreted this ECG.   Date: 03/29/2015  EKG Time: 9:09 PM  Rate: 155  Rhythm: atrial fibrillation, rate 155  Axis: Normal  Intervals:none  ST&T Change:  Non-specific   ____________________________________________    RADIOLOGY I have personally reviewed any xrays that were ordered on this patient: Bronchitic changes on chest x-ray  ____________________________________________   PROCEDURES  Procedure(s) performed: none  Critical Care performed: yes  CRITICAL CARE Performed by: Lavonia Drafts   Total critical care time:30 minutes  Critical care time was exclusive of separately billable procedures and treating other patients.  Critical care was necessary to treat or prevent imminent or life-threatening deterioration.  Critical care was time spent personally by me on the following activities: development of treatment plan with patient and/or surrogate as well as nursing, discussions with consultants, evaluation of patient's response to treatment, examination of patient, obtaining history from patient or surrogate, ordering and performing treatments and interventions, ordering and review of laboratory studies, ordering and review of radiographic studies, pulse oximetry and re-evaluation of patient's condition.  ____________________________________________   INITIAL IMPRESSION / ASSESSMENT AND PLAN / ED COURSE  Pertinent labs & imaging results that were available during my care of the patient were reviewed by me and considered in my medical decision making (see chart for details).  Patient with new onset afib with RVR with cough and chills x 1 week. Possible PNA as cause of Afib, will give IV fluids and obtain CXR/labs.   CXR c/w bronchitis, will  give IV azithromycin. Elevated troponin likely related to strain but this will need to be trended. IV Cardizem 10 mg given. Drip started   Will admit for new onset AFIB and elevated troponin  ____________________________________________   FINAL CLINICAL IMPRESSION(S) / ED DIAGNOSES  Final diagnoses:  Atrial fibrillation, unspecified type ([place])  Elevated troponin  Bronchitis   Acute on chronic renal failure (H[facility])     Lavonia Drafts, MD 03/29/15 2248

## 2015-03-29 NOTE — ED Notes (Signed)
Been off her prescribed medications since yesterday am per her son. Found this evening on the floor in front of her bed. Possible fall.

## 2015-03-30 ENCOUNTER — Inpatient Hospital Stay
Admit: 2015-03-30 | Discharge: 2015-03-30 | Disposition: A | Discharge status: Home / Self Care | Payer: Medicare Other | Attending: Internal Medicine | Admitting: Internal Medicine

## 2015-03-30 DIAGNOSIS — J209 Acute bronchitis, unspecified: Secondary | ICD-10-CM | POA: Diagnosis present

## 2015-03-30 DIAGNOSIS — I4891 Unspecified atrial fibrillation: Secondary | ICD-10-CM | POA: Diagnosis present

## 2015-03-30 DIAGNOSIS — E782 Mixed hyperlipidemia: Secondary | ICD-10-CM | POA: Diagnosis present

## 2015-03-30 DIAGNOSIS — J40 Bronchitis, not specified as acute or chronic: Secondary | ICD-10-CM | POA: Diagnosis present

## 2015-03-30 DIAGNOSIS — Z9861 Coronary angioplasty status: Secondary | ICD-10-CM | POA: Diagnosis not present

## 2015-03-30 DIAGNOSIS — Z79899 Other long term (current) drug therapy: Secondary | ICD-10-CM | POA: Diagnosis not present

## 2015-03-30 DIAGNOSIS — Z888 Allergy status to other drugs, medicaments and biological substances status: Secondary | ICD-10-CM | POA: Diagnosis not present

## 2015-03-30 DIAGNOSIS — F0390 Unspecified dementia without behavioral disturbance: Secondary | ICD-10-CM | POA: Diagnosis present

## 2015-03-30 DIAGNOSIS — N184 Chronic kidney disease, stage 4 (severe): Secondary | ICD-10-CM | POA: Diagnosis present

## 2015-03-30 DIAGNOSIS — N189 Chronic kidney disease, unspecified: Secondary | ICD-10-CM | POA: Diagnosis present

## 2015-03-30 DIAGNOSIS — Z9012 Acquired absence of left breast and nipple: Secondary | ICD-10-CM | POA: Diagnosis not present

## 2015-03-30 DIAGNOSIS — I130 Hypertensive heart and chronic kidney disease with heart failure and stage 1 through stage 4 chronic kidney disease, or unspecified chronic kidney disease: Secondary | ICD-10-CM | POA: Diagnosis present

## 2015-03-30 DIAGNOSIS — K219 Gastro-esophageal reflux disease without esophagitis: Secondary | ICD-10-CM | POA: Diagnosis present

## 2015-03-30 DIAGNOSIS — I248 Other forms of acute ischemic heart disease: Secondary | ICD-10-CM | POA: Diagnosis present

## 2015-03-30 DIAGNOSIS — I129 Hypertensive chronic kidney disease with stage 1 through stage 4 chronic kidney disease, or unspecified chronic kidney disease: Secondary | ICD-10-CM | POA: Diagnosis present

## 2015-03-30 DIAGNOSIS — J45909 Unspecified asthma, uncomplicated: Secondary | ICD-10-CM | POA: Diagnosis present

## 2015-03-30 DIAGNOSIS — Z823 Family history of stroke: Secondary | ICD-10-CM | POA: Diagnosis not present

## 2015-03-30 DIAGNOSIS — F329 Major depressive disorder, single episode, unspecified: Secondary | ICD-10-CM | POA: Diagnosis present

## 2015-03-30 DIAGNOSIS — E039 Hypothyroidism, unspecified: Secondary | ICD-10-CM | POA: Diagnosis present

## 2015-03-30 DIAGNOSIS — Z880 Allergy status to penicillin: Secondary | ICD-10-CM | POA: Diagnosis not present

## 2015-03-30 DIAGNOSIS — I2510 Atherosclerotic heart disease of native coronary artery without angina pectoris: Secondary | ICD-10-CM | POA: Diagnosis present

## 2015-03-30 DIAGNOSIS — Z886 Allergy status to analgesic agent status: Secondary | ICD-10-CM | POA: Diagnosis not present

## 2015-03-30 DIAGNOSIS — Z9842 Cataract extraction status, left eye: Secondary | ICD-10-CM | POA: Diagnosis not present

## 2015-03-30 DIAGNOSIS — Z90710 Acquired absence of both cervix and uterus: Secondary | ICD-10-CM | POA: Diagnosis not present

## 2015-03-30 DIAGNOSIS — Z8249 Family history of ischemic heart disease and other diseases of the circulatory system: Secondary | ICD-10-CM | POA: Diagnosis not present

## 2015-03-30 DIAGNOSIS — N179 Acute kidney failure, unspecified: Secondary | ICD-10-CM | POA: Diagnosis present

## 2015-03-30 DIAGNOSIS — E876 Hypokalemia: Secondary | ICD-10-CM | POA: Diagnosis present

## 2015-03-30 DIAGNOSIS — I5022 Chronic systolic (congestive) heart failure: Secondary | ICD-10-CM | POA: Diagnosis present

## 2015-03-30 LAB — URINALYSIS COMPLETE WITH MICROSCOPIC (ARMC ONLY)
BILIRUBIN URINE: NEGATIVE
Glucose, UA: 50 mg/dL — AB
KETONES UR: NEGATIVE mg/dL
Nitrite: NEGATIVE
PH: 5 (ref 5.0–8.0)
Protein, ur: 30 mg/dL — AB
SPECIFIC GRAVITY, URINE: 1.011 (ref 1.005–1.030)
Trans Epithel, UA: <1

## 2015-03-30 LAB — TROPONIN I
Troponin I: 0.33 ng/mL — ABNORMAL HIGH (ref <0.031)
Troponin I: 0.32 ng/mL — ABNORMAL HIGH (ref <0.031)

## 2015-03-30 LAB — TSH: TSH: 3.763 u[IU]/mL (ref 0.350–4.500)

## 2015-03-30 MED ORDER — ACETAMINOPHEN 650 MG RE SUPP: 650.0000 mg | Freq: Four times a day (QID) | RECTAL | Status: DC | PRN

## 2015-03-30 MED ORDER — ONDANSETRON HCL 4 MG/2ML IJ SOLN: 4.0000 mg | Freq: Four times a day (QID) | INTRAMUSCULAR | Status: DC | PRN

## 2015-03-30 MED ORDER — VITAMIN C 500 MG PO TABS
1000.0000 mg | ORAL_TABLET | Freq: Every day | ORAL | Status: DC
  Administered 2015-03-30 – 2015-04-02 (×4): 1000 mg via ORAL
  Filled 2015-03-30 (×4): qty 2

## 2015-03-30 MED ORDER — SODIUM CHLORIDE 0.9 % IV SOLN
INTRAVENOUS | Status: DC
  Administered 2015-03-30: 03:00:00 via INTRAVENOUS

## 2015-03-30 MED ORDER — LORAZEPAM 1 MG PO TABS: 1.0000 mg | ORAL_TABLET | ORAL | Status: DC | PRN

## 2015-03-30 MED ORDER — VITAMIN D 1000 UNITS PO TABS
4000.0000 [IU] | ORAL_TABLET | Freq: Every day | ORAL | Status: DC
  Administered 2015-03-30 – 2015-04-02 (×4): 4000 [IU] via ORAL
  Filled 2015-03-30 (×4): qty 4

## 2015-03-30 MED ORDER — METOPROLOL SUCCINATE ER 25 MG PO TB24
25.0000 mg | ORAL_TABLET | Freq: Every day | ORAL | Status: DC
  Administered 2015-03-30: 25 mg via ORAL
  Filled 2015-03-30: qty 1

## 2015-03-30 MED ORDER — HEPARIN SODIUM (PORCINE) 5000 UNIT/ML IJ SOLN
5000.0000 [IU] | Freq: Three times a day (TID) | INTRAMUSCULAR | Status: DC
  Administered 2015-03-30 – 2015-04-01 (×6): 5000 [IU] via SUBCUTANEOUS
  Filled 2015-03-30 (×6): qty 1

## 2015-03-30 MED ORDER — BUDESONIDE 0.25 MG/2ML IN SUSP
0.2500 mg | Freq: Two times a day (BID) | RESPIRATORY_TRACT | Status: DC
  Administered 2015-03-30 – 2015-04-02 (×4): 0.25 mg via RESPIRATORY_TRACT
  Filled 2015-03-30 (×7): qty 2

## 2015-03-30 MED ORDER — FUROSEMIDE 20 MG PO TABS
20.0000 mg | ORAL_TABLET | Freq: Two times a day (BID) | ORAL | Status: DC
  Administered 2015-03-30 – 2015-04-02 (×7): 20 mg via ORAL
  Filled 2015-03-30 (×7): qty 1

## 2015-03-30 MED ORDER — PREDNISONE 5 MG PO TABS: 5.0000 mg | ORAL_TABLET | Freq: Every day | ORAL | Status: DC

## 2015-03-30 MED ORDER — ESCITALOPRAM OXALATE 10 MG PO TABS
10.0000 mg | ORAL_TABLET | Freq: Every day | ORAL | Status: DC
  Administered 2015-03-30 – 2015-04-02 (×4): 10 mg via ORAL
  Filled 2015-03-30 (×4): qty 1

## 2015-03-30 MED ORDER — SIMVASTATIN 20 MG PO TABS
20.0000 mg | ORAL_TABLET | Freq: Every day | ORAL | Status: DC
  Administered 2015-03-30 – 2015-04-01 (×3): 20 mg via ORAL
  Filled 2015-03-30 (×3): qty 1

## 2015-03-30 MED ORDER — FERROUS SULFATE 325 (65 FE) MG PO TABS
325.0000 mg | ORAL_TABLET | Freq: Two times a day (BID) | ORAL | Status: DC
  Administered 2015-03-30 – 2015-04-02 (×7): 325 mg via ORAL
  Filled 2015-03-30 (×7): qty 1

## 2015-03-30 MED ORDER — DILTIAZEM HCL 60 MG PO TABS
60.0000 mg | ORAL_TABLET | Freq: Four times a day (QID) | ORAL | Status: DC
Start: 2015-03-30 — End: 2015-03-30
  Administered 2015-03-30: 60 mg via ORAL
  Filled 2015-03-30: qty 1

## 2015-03-30 MED ORDER — AZITHROMYCIN 250 MG PO TABS
250.0000 mg | ORAL_TABLET | Freq: Every day | ORAL | Status: DC
  Administered 2015-03-30 – 2015-04-02 (×4): 250 mg via ORAL
  Filled 2015-03-30 (×4): qty 1

## 2015-03-30 MED ORDER — UTI-STAT PO LIQD: 1.0000 | Freq: Two times a day (BID) | ORAL | Status: DC

## 2015-03-30 MED ORDER — AMLODIPINE BESYLATE 5 MG PO TABS
2.5000 mg | ORAL_TABLET | Freq: Every day | ORAL | Status: DC
  Administered 2015-03-30: 2.5 mg via ORAL
  Filled 2015-03-30: qty 1

## 2015-03-30 MED ORDER — PREDNISONE 20 MG PO TABS
20.0000 mg | ORAL_TABLET | Freq: Every day | ORAL | Status: AC
  Administered 2015-04-02: 20 mg via ORAL
  Filled 2015-03-30: qty 1

## 2015-03-30 MED ORDER — DEXTROSE 5 % IV SOLN
5.0000 mg/h | INTRAVENOUS | Status: DC
  Administered 2015-03-30: 10 mg/h via INTRAVENOUS
  Administered 2015-03-30: 15 mg/h via INTRAVENOUS
  Administered 2015-03-30: 12.5 mg/h via INTRAVENOUS
  Administered 2015-03-30: 15 mg/h via INTRAVENOUS
  Administered 2015-03-30: 5 mg/h via INTRAVENOUS
  Filled 2015-03-30: qty 100

## 2015-03-30 MED ORDER — PREDNISONE 20 MG PO TABS
40.0000 mg | ORAL_TABLET | Freq: Every day | ORAL | Status: AC
  Administered 2015-03-31: 40 mg via ORAL
  Filled 2015-03-30: qty 2

## 2015-03-30 MED ORDER — IPRATROPIUM-ALBUTEROL 0.5-2.5 (3) MG/3ML IN SOLN
3.0000 mL | Freq: Four times a day (QID) | RESPIRATORY_TRACT | Status: DC
  Administered 2015-03-30 (×2): 3 mL via RESPIRATORY_TRACT
  Filled 2015-03-30 (×3): qty 3

## 2015-03-30 MED ORDER — ONDANSETRON HCL 4 MG PO TABS: 4.0000 mg | ORAL_TABLET | Freq: Four times a day (QID) | ORAL | Status: DC | PRN

## 2015-03-30 MED ORDER — ACETAMINOPHEN 325 MG PO TABS: 650.0000 mg | ORAL_TABLET | Freq: Four times a day (QID) | ORAL | Status: DC | PRN

## 2015-03-30 MED ORDER — DOCUSATE SODIUM 100 MG PO CAPS
100.0000 mg | ORAL_CAPSULE | Freq: Two times a day (BID) | ORAL | Status: DC
  Administered 2015-03-30 – 2015-04-02 (×7): 100 mg via ORAL
  Filled 2015-03-30 (×7): qty 1

## 2015-03-30 MED ORDER — PREDNISONE 10 MG PO TABS: 10.0000 mg | ORAL_TABLET | Freq: Every day | ORAL | Status: DC

## 2015-03-30 MED ORDER — SODIUM CHLORIDE 0.9 % IJ SOLN
3.0000 mL | Freq: Two times a day (BID) | INTRAMUSCULAR | Status: DC
  Administered 2015-03-30 – 2015-04-02 (×8): 3 mL via INTRAVENOUS

## 2015-03-30 MED ORDER — POTASSIUM CHLORIDE CRYS ER 10 MEQ PO TBCR
10.0000 meq | EXTENDED_RELEASE_TABLET | Freq: Every day | ORAL | Status: DC
  Administered 2015-03-30 – 2015-03-31 (×2): 10 meq via ORAL
  Filled 2015-03-30 (×2): qty 1

## 2015-03-30 MED ORDER — DILTIAZEM HCL 30 MG PO TABS
30.0000 mg | ORAL_TABLET | Freq: Four times a day (QID) | ORAL | Status: DC
  Administered 2015-03-30 – 2015-03-31 (×3): 30 mg via ORAL
  Filled 2015-03-30 (×3): qty 1

## 2015-03-30 MED ORDER — MEMANTINE HCL 10 MG PO TABS
10.0000 mg | ORAL_TABLET | Freq: Two times a day (BID) | ORAL | Status: DC
  Administered 2015-03-30 – 2015-04-02 (×7): 10 mg via ORAL
  Filled 2015-03-30 (×10): qty 1

## 2015-03-30 MED ORDER — RAMIPRIL 5 MG PO CAPS
10.0000 mg | ORAL_CAPSULE | Freq: Every day | ORAL | Status: DC
  Administered 2015-03-30: 10 mg via ORAL
  Filled 2015-03-30: qty 2

## 2015-03-30 MED ORDER — LEVOTHYROXINE SODIUM 75 MCG PO TABS
75.0000 ug | ORAL_TABLET | Freq: Every day | ORAL | Status: DC
  Administered 2015-03-30 – 2015-04-02 (×4): 75 ug via ORAL
  Filled 2015-03-30 (×4): qty 1

## 2015-03-30 MED ORDER — PREDNISONE 50 MG PO TABS
50.0000 mg | ORAL_TABLET | Freq: Every day | ORAL | Status: AC
  Administered 2015-03-30: 50 mg via ORAL
  Filled 2015-03-30: qty 1

## 2015-03-30 MED ORDER — PREDNISONE 20 MG PO TABS
30.0000 mg | ORAL_TABLET | Freq: Every day | ORAL | Status: AC
  Administered 2015-04-01: 30 mg via ORAL
  Filled 2015-03-30: qty 1

## 2015-03-30 MED ORDER — DONEPEZIL HCL 5 MG PO TABS
10.0000 mg | ORAL_TABLET | Freq: Every day | ORAL | Status: DC
  Administered 2015-03-30 – 2015-04-01 (×3): 10 mg via ORAL
  Filled 2015-03-30 (×3): qty 2

## 2015-03-30 NOTE — Progress Notes (Signed)
Pt on Cardizem gtt at 15mg /hr infusing lt ac.  Denies chest pain, dizziness or shortness of breath at this time.  VS stable, will continue to monitor pt.

## 2015-03-30 NOTE — Progress Notes (Signed)
PHARMACIST - PHYSICIAN ORDER COMMUNICATION  CONCERNING: P&T Medication Policy on Herbal Medications  DESCRIPTION:  This patient's order for:  UTI-STAT  has been noted.  This product(s) is classified as an "herbal" or natural product. Due to a lack of definitive safety studies or FDA approval, nonstandard manufacturing practices, plus the potential risk of unknown drug-drug interactions while on inpatient medications, the Pharmacy and Therapeutics Committee does not permit the use of "herbal" or natural products of this type within [facility].   ACTION TAKEN: The pharmacy department is unable to verify this order at this time. Please reevaluate patient's clinical condition at discharge and address if the herbal or natural product(s) should be resumed at that time.

## 2015-03-30 NOTE — Progress Notes (Signed)
Dr. Leslye Peer made aware of cardizem gtt stopped, new orders received.

## 2015-03-30 NOTE — Progress Notes (Signed)
HR in 70's, Cardizem drip titrated to 5mg /hr verified by T. Sherren Mocha, RN

## 2015-03-30 NOTE — Progress Notes (Signed)
HR running 80-90's, Cardizem gtt decreased to 10mg /hr VSS, verified by Riesa Pope, RN.  Will continue to monitor pt.Holly Burns

## 2015-03-30 NOTE — Progress Notes (Signed)
*  PRELIMINARY RESULTS* Echocardiogram 2D Echocardiogram has been performed.  Holly Burns 03/30/2015, 12:11 PM

## 2015-03-30 NOTE — H&P (Signed)
Holly Burns is an 81 y.o. female.   Chief Complaint: Cough HPI: The patient presents emergency department complaining of a cough. She tells me that it has not been productive but she reported scant amounts of phlegm to the emergency department physician. She does not use oxygen at home but requires some supplemental O2 intermittently here in the hospital. Chest x-ray shows chronic changes without infiltrate. However the patient does have a mild leukocytosis. She was also found to have atrial fibrillation with rapid ventricular rate as well as associated increase in troponin. The patient denies shortness of breath or chest pain. She also denies fevers but admits to feeling "clammy" and "hot all over". The above constellation of symptoms prompted the emergency department staff to call for admission.  Past Medical History  Diagnosis Date  . CHF (congestive heart failure) ([place])   . Depression   . GERD (gastroesophageal reflux disease)   . Hyperlipidemia   . Hypertension   . Thyroid disease     Past Surgical History  Procedure Laterality Date  . Angioplasty      due to MI LV ef-40%  . 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 Craniotomy    . Abdominal hysterectomy  1980  . Cataract surgery Left     Family History  Problem Relation Age of Onset  . Hypertension Mother   . Cerebrovascular Accident Mother   . Heart disease Father    Social History:  reports that she has never smoked. She has never used smokeless tobacco. She reports that she does not drink alcohol or use illicit drugs.  Allergies:  Allergies  Allergen Reactions  . Codeine Other (See Comments)    Reaction: unknown  . Penicillins Other (See Comments)    Unknown reaction and unable to answer follow-up questions  . Phenytoin Sodium Extended Other (See Comments)    Hyperthermia    Medications Prior to Admission  Medication Sig Dispense Refill  .  acetaminophen (TYLENOL) 500 MG tablet Take 500-1,000 mg by mouth every 6 (six) hours as needed.     Marland Kitchen amLODipine (NORVASC) 5 MG tablet Take 2.5 mg by mouth daily.     . Cholecalciferol (VITAMIN D HIGH POTENCY) 1000 UNITS capsule Take 4,000 Units by mouth daily.     . Cranberry-Vitamin C-Inulin (UTI-STAT) LIQD Take 1 capsule by mouth 2 (two) times daily.     Marland Kitchen donepezil (ARICEPT) 10 MG tablet Take 10 mg by mouth at bedtime.    Marland Kitchen escitalopram (LEXAPRO) 10 MG tablet Take 10 mg by mouth daily.     . Ferrous Sulfate (IRON) 325 (65 FE) MG TABS Take 1 tablet by mouth 2 (two) times daily.     . furosemide (LASIX) 20 MG tablet Take 1 tablet (20 mg total) by mouth 2 (two) times daily. 180 tablet 3  . levothyroxine (SYNTHROID) 75 MCG tablet Take 1 tablet (75 mcg total) by mouth daily before breakfast. 90 tablet 3  . LORazepam (ATIVAN) 1 MG tablet Take 1 tablet (1 mg total) by mouth every 4 (four) hours as needed for anxiety. (Patient taking differently: Take 0.5-1 mg by mouth every 4 (four) hours as needed for anxiety. ) 180 tablet 1  . memantine (NAMENDA) 10 MG tablet Take 10 mg by mouth 2 (two) times daily.     . metoprolol succinate (TOPROL-XL) 25 MG 24 hr tablet Take 1 tablet (25 mg total) by mouth daily.  90 tablet 3  . omeprazole (PRILOSEC) 20 MG capsule Take 20 mg by mouth daily.     . potassium chloride (MICRO-K) 10 MEQ CR capsule Take 1 capsule (10 mEq total) by mouth daily. 90 capsule 3  . ramipril (ALTACE) 10 MG capsule Take 1 capsule (10 mg total) by mouth daily. 90 capsule 3  . simvastatin (ZOCOR) 20 MG tablet Take 1 tablet (20 mg total) by mouth at bedtime. 90 tablet 3  . vitamin C (ASCORBIC ACID) 500 MG tablet Take 1,000 mg by mouth daily.     . metoprolol succinate (TOPROL-XL) 25 MG 24 hr tablet TAKE 1 TABLET DAILY. (Patient not taking: Reported on 03/29/2015) 90 tablet 0  . ramipril (ALTACE) 10 MG capsule 1 capsule Capsule, Oral, daily (Patient not taking: Reported on 03/29/2015) 90 capsule 0     Results for orders placed or performed during the hospital encounter of 03/29/15 (from the past 48 hour(s))  CBC     Status: Abnormal   Collection Time: 03/29/15  9:29 PM  Result Value Ref Range   WBC 14.0 (H) 3.6 - 11.0 K/uL   RBC 4.16 3.80 - 5.20 MIL/uL   Hemoglobin 12.9 12.0 - 16.0 g/dL   HCT 40.2 35.0 - 47.0 %   MCV 96.6 80.0 - 100.0 fL   MCH 31.0 26.0 - 34.0 pg   MCHC 32.1 32.0 - 36.0 g/dL   RDW 13.5 11.5 - 14.5 %   Platelets 300 150 - 440 K/uL  Comprehensive metabolic panel     Status: Abnormal   Collection Time: 03/29/15  9:29 PM  Result Value Ref Range   Sodium 143 135 - 145 mmol/L   Potassium 3.6 3.5 - 5.1 mmol/L   Chloride 107 101 - 111 mmol/L   CO2 23 22 - 32 mmol/L   Glucose, Bld 191 (H) 65 - 99 mg/dL   BUN 33 (H) 6 - 20 mg/dL   Creatinine, Ser 2.01 (H) 0.44 - 1.00 mg/dL   Calcium 9.0 8.9 - 10.3 mg/dL   Total Protein 7.5 6.5 - 8.1 g/dL   Albumin 3.9 3.5 - 5.0 g/dL   AST 34 15 - 41 U/L   ALT 45 14 - 54 U/L   Alkaline Phosphatase 76 38 - 126 U/L   Total Bilirubin 1.1 0.3 - 1.2 mg/dL   GFR calc non Af Amer 21 (L) >60 mL/min   GFR calc Af Amer 24 (L) >60 mL/min    Comment: (NOTE) The eGFR has been calculated using the CKD EPI equation. This calculation has not been validated in all clinical situations. eGFR's persistently <60 mL/min signify possible Chronic Kidney Disease.    Anion gap 13 5 - 15  Troponin I     Status: Abnormal   Collection Time: 03/29/15  9:29 PM  Result Value Ref Range   Troponin I 0.21 (H) <0.031 ng/mL    Comment: READ BACK AND VERIFIED WITH EMMA HUNTER AT 2208 03/29/2015 BY TFK        PERSISTENTLY INCREASED TROPONIN VALUES IN THE RANGE OF 0.04-0.49 ng/mL CAN BE SEEN IN:       -UNSTABLE ANGINA       -CONGESTIVE HEART FAILURE       -MYOCARDITIS       -CHEST TRAUMA       -ARRYHTHMIAS       -LATE PRESENTING MYOCARDIAL INFARCTION       -COPD   CLINICAL FOLLOW-UP RECOMMENDED.   TSH     Status: None  Collection Time: 03/29/15   9:29 PM  Result Value Ref Range   TSH 3.763 0.350 - 4.500 uIU/mL  Troponin I     Status: Abnormal   Collection Time: 03/30/15  4:16 AM  Result Value Ref Range   Troponin I 0.33 (H) <0.031 ng/mL    Comment: PREVIOUS RESULT CALLED TO EMMA HUNTER ON 03/29/15 AT 2208 BY TFK/QSD        PERSISTENTLY INCREASED TROPONIN VALUES IN THE RANGE OF 0.04-0.49 ng/mL CAN BE SEEN IN:       -UNSTABLE ANGINA       -CONGESTIVE HEART FAILURE       -MYOCARDITIS       -CHEST TRAUMA       -ARRYHTHMIAS       -LATE PRESENTING MYOCARDIAL INFARCTION       -COPD   CLINICAL FOLLOW-UP RECOMMENDED.    Dg Chest Portable 1 View  03/29/2015  CLINICAL DATA:  Cough. Patient was found this evening on the floor in front of her bed. Possible fall. History of CHF and hypertension. EXAM: PORTABLE CHEST 1 VIEW COMPARISON:  None. FINDINGS: Normal heart size and pulmonary vascularity. Mild interstitial changes in the lungs with peribronchial thickening suggesting chronic bronchitis. No focal airspace disease or consolidation in the lungs. No blunting of costophrenic angles. No pneumothorax. Calcified and tortuous aorta. Postoperative changes with left mastectomy and surgical clips in the left axilla. IMPRESSION: Probable chronic bronchitic changes in the lungs. No evidence of active pulmonary disease. Electronically Signed   By: Lucienne Capers M.D.   On: 03/29/2015 22:13    Review of Systems  Constitutional: Negative for fever and chills.  HENT: Positive for sore throat. Negative for tinnitus.   Eyes: Negative for blurred vision and redness.  Respiratory: Negative for cough and shortness of breath.   Cardiovascular: Negative for chest pain, palpitations, orthopnea and PND.  Gastrointestinal: Negative for nausea, vomiting, abdominal pain and diarrhea.  Genitourinary: Negative for dysuria, urgency and frequency.  Musculoskeletal: Negative for myalgias and joint pain.  Skin: Negative for rash.       No lesions   Neurological: Negative for speech change, focal weakness and weakness.  Endo/Heme/Allergies: Does not bruise/bleed easily.       No temperature intolerance  Psychiatric/Behavioral: Negative for depression and suicidal ideas.    Blood pressure 156/58, pulse 120, temperature 98.6 F (37 C), temperature source Oral, resp. rate 18, height _0  (1.575 m), weight 54.296 kg (119 lb 11.2 oz), SpO2 96 %. Physical Exam  Vitals reviewed. Constitutional: She is oriented to person, place, and time. She appears well-developed and well-nourished. No distress.  HENT:  Head: Normocephalic and atraumatic.  Mouth/Throat: Oropharynx is clear and moist.  Eyes: Conjunctivae and EOM are normal. Pupils are equal, round, and reactive to light. No scleral icterus.  Neck: Normal range of motion. Neck supple. No JVD present. No tracheal deviation present. No thyromegaly present.  Cardiovascular: Normal rate, regular rhythm and normal heart sounds.  Exam reveals no gallop and no friction rub.   No murmur heard. Respiratory: Effort normal and breath sounds normal.  GI: Soft. Bowel sounds are normal. She exhibits no distension. There is no tenderness.  Genitourinary:  Deferred  Musculoskeletal: Normal range of motion. She exhibits no edema.  Lymphadenopathy:    She has no cervical adenopathy.  Neurological: She is alert and oriented to person, place, and time. No cranial nerve deficit. She exhibits normal muscle tone.  Skin: Skin is warm and dry. No rash noted.  No erythema.  Psychiatric: She has a normal mood and affect. Her behavior is normal. Judgment and thought content normal. She exhibits abnormal recent memory.     Assessment/Plan This is an 81 year old Caucasian female admitted for atrial fibrillation with rapid ventricular rate secondary to respiratory distress. 1. Atrial fibrillation with rapid ventricular rate: The patient has not been diagnosed with atrial fibrillation in the past. Etiology is likely  inflammation within the lungs. The patient's been started on a Cardizem drip which we will titrate for heart rate. 2. Respiratory distress: No infiltrates seen and it still unclear if the patient clinically has pneumonia although she does have the appearance of chronic inflammatory changes on chest x-ray. She denies tobacco use or exposure to secondhand smoke. Breathing treatments as necessary. We are currently treating for working diagnosis of community acquired pneumonia 3. Essential hypertension: Continue amlodipine as well as ramipril 4. Hypothyroidism: Continue Synthroid 5. Congestive heart failure: Stable; unclear systolic versus diastolic. Continue Lasix per home regimen 6. Dementia: Continue donepezil and memantine  7. Depression: Continue Lexapro 8. DVT prophylaxis: Heparin 9. GI prophylaxis: None The patient is a full code. Time spent on admission orders and critical patient care approximately 45 minutes  Harrie Foreman 03/30/2015, 6:45 AM

## 2015-03-30 NOTE — Care Management (Signed)
Patient admitted from home with a fib and respiratory distress .  Patient lives at home alone in an independent retirement community [employer].  Patient's adult son and daughter are at bedside.  Family states that patient is able to complete all of her ADL's, however is forgetful at times of things such as taking her medication. Patient ambulates without any devices, but has a 4 prong cane in the home if needed.  Patient uses [facility] in [place] to obtain her medication.  Patient has chronic O2 at home that is utilized at night only.  Family and patient state that they just changed oxygen companies and are not sure.   If patient requires continues O2 at time of discharge will need qualifying O2 sats.  Family requested list of Personal Care service agencies, provided to son and daughter.  RNCM following for discharge planning.

## 2015-03-30 NOTE — Progress Notes (Signed)
Patient ID: MINOLA PIWOWARCZYK, female   DOB: June 23, 1924, 81 y.o.   MRN: [id number] [facility] PROGRESS NOTE  PCP: Margarita Rana, MD  HPI/Subjective: Patient seen earlier and breathing better. She is placed on Cardizem drip for rapid atrial fibrillation. Patient was admitted for respiratory distress. She is breathing better than upon admission.  Objective: Filed Vitals:   03/30/15 1453 03/30/15 1530  BP: 99/49 107/51  Pulse: 57 53  Temp:    Resp:      Filed Weights   03/29/15 2105 03/30/15 0157  Weight: 58.06 kg (128 lb) 54.296 kg (119 lb 11.2 oz)    ROS: Review of Systems  Constitutional: Negative for fever and chills.  Eyes: Negative for blurred vision.  Respiratory: Positive for cough, shortness of breath and wheezing.   Cardiovascular: Positive for palpitations. Negative for chest pain.  Gastrointestinal: Negative for nausea, vomiting, abdominal pain, diarrhea and constipation.  Genitourinary: Negative for dysuria.  Musculoskeletal: Negative for joint pain.  Neurological: Negative for dizziness and headaches.   Exam: Physical Exam  HENT:  Nose: No mucosal edema.  Mouth/Throat: No oropharyngeal exudate or posterior oropharyngeal edema.  Eyes: Conjunctivae, EOM and lids are normal. Pupils are equal, round, and reactive to light.  Neck: No JVD present. Carotid bruit is not present. No edema present. No thyroid mass and no thyromegaly present.  Cardiovascular: S1 normal and S2 normal.  Exam reveals no gallop.   No murmur heard. Pulses:      Dorsalis pedis pulses are 2+ on the right side, and 2+ on the left side.  Respiratory: No respiratory distress. She has decreased breath sounds in the right upper field, the right middle field, the right lower field, the left upper field, the left middle field and the left lower field. She has wheezes in the right upper field and the left upper field. She has no rhonchi. She has rales in the right lower field and the left  lower field.  GI: Soft. Bowel sounds are normal. There is no tenderness.  Musculoskeletal:       Right ankle: She exhibits swelling.       Left ankle: She exhibits swelling.  Lymphadenopathy:    She has no cervical adenopathy.  Neurological: She is alert. No cranial nerve deficit.  Skin: Skin is warm. No rash noted. Nails show no clubbing.  Psychiatric: She has a normal mood and affect.    Data Reviewed: Basic Metabolic Panel:  Recent Labs Lab 03/29/15 2129  NA 143  K 3.6  CL 107  CO2 23  GLUCOSE 191*  BUN 33*  CREATININE 2.01*  CALCIUM 9.0   Liver Function Tests:  Recent Labs Lab 03/29/15 2129  AST 34  ALT 45  ALKPHOS 76  BILITOT 1.1  PROT 7.5  ALBUMIN 3.9   CBC:  Recent Labs Lab 03/29/15 2129  WBC 14.0*  HGB 12.9  HCT 40.2  MCV 96.6  PLT 300   Cardiac Enzymes:  Recent Labs Lab 03/29/15 2129 03/30/15 0416 03/30/15 1009  TROPONINI 0.21* 0.33* 0.32*     Studies: Dg Chest Portable 1 View  03/29/2015  CLINICAL DATA:  Cough. Patient was found this evening on the floor in front of her bed. Possible fall. History of CHF and hypertension. EXAM: PORTABLE CHEST 1 VIEW COMPARISON:  None. FINDINGS: Normal heart size and pulmonary vascularity. Mild interstitial changes in the lungs with peribronchial thickening suggesting chronic bronchitis. No focal airspace disease or consolidation in the lungs. No blunting of costophrenic angles.  No pneumothorax. Calcified and tortuous aorta. Postoperative changes with left mastectomy and surgical clips in the left axilla. IMPRESSION: Probable chronic bronchitic changes in the lungs. No evidence of active pulmonary disease. Electronically Signed   By: Lucienne Capers M.D.   On: 03/29/2015 22:13    Scheduled Meds: . azithromycin  250 mg Oral Daily  . budesonide (PULMICORT) nebulizer solution  0.25 mg Nebulization BID  . cholecalciferol  4,000 Units Oral Daily  . diltiazem  30 mg Oral 4 times per day  . docusate sodium   100 mg Oral BID  . donepezil  10 mg Oral QHS  . escitalopram  10 mg Oral Daily  . ferrous sulfate  325 mg Oral BID  . furosemide  20 mg Oral BID  . heparin  5,000 Units Subcutaneous 3 times per day  . ipratropium-albuterol  3 mL Nebulization Q6H  . levothyroxine  75 mcg Oral QAC breakfast  . memantine  10 mg Oral BID  . metoprolol succinate  25 mg Oral Daily  . potassium chloride  10 mEq Oral Daily  . [START ON 03/31/2015] predniSONE  40 mg Oral Q breakfast   Followed by  . [START ON 04/01/2015] predniSONE  30 mg Oral Q breakfast   Followed by  . [START ON 04/02/2015] predniSONE  20 mg Oral Q breakfast   Followed by  . [START ON 04/03/2015] predniSONE  10 mg Oral Q breakfast   Followed by  . [START ON 04/04/2015] predniSONE  5 mg Oral Q breakfast  . ramipril  10 mg Oral Daily  . simvastatin  20 mg Oral QHS  . sodium chloride  3 mL Intravenous Q12H  . vitamin C  1,000 mg Oral Daily    Assessment/Plan:  1. Atrial fibrillation with rapid ventricular response. Patient was on IV Cardizem drip when I saw her. I started Cardizem 60 mg by mouth every 6 hours to get off the drip. Patient is also on low-dose metoprolol. Just now the nurse has taken off the Cardizem drip for heart rate better controlled. And I will decrease the Cardizem to 30 mg every 6 hours. I will obtain an echocardiogram. I believe this is all secondary to the lung process 2. Asthmatic bronchitis- prednisone taper, Zithromax, budesonide and DuoNeb nebulizer solutions to get better air entry. 3. Hyperlipidemia unspecified continue simvastatin 4. Hypothyroidism unspecified continue levothyroxine 5. Depression on Lexapro 6. History of CHF- I believe the lungs are secondary to asthmatic bronchitis rather than CHF. Patient is on oral Lasix. I stopped IV fluids when I saw her. 7. Essential hypertension continue usual medications 8. Elevated troponin demand ischemia from rapid heart rate 9. Acute renal failure on chronic kidney  disease- patient was hydrated from the emergency room. With her history of CHF and rather watch her fluid status without IV fluids. Continue to monitor creatinine daily. Hold Altace.  Code Status:     Code Status Orders        Start     Ordered   03/30/15 0206  Full code   Continuous     03/30/15 0205     Family Communication: Son and daughter at bedside Disposition Plan: Can be discharged once moving better air  Consultants:  Cardiology  Antibiotics:  Zithromax  Time spent: 35 minutes  [place], [place] [facility]

## 2015-03-30 NOTE — Progress Notes (Signed)
HR 60-70's, BP stable, Cardizem gtt stopper per protocol. Will continue to monitor pt.

## 2015-03-30 NOTE — Plan of Care (Signed)
Problem: Safety: Goal: Ability to remain free from injury will improve Outcome: Progressing Fall precautions in place  Problem: Pain Managment: Goal: General experience of comfort will improve Outcome: Progressing Prn meds  Problem: Tissue Perfusion: Goal: Risk factors for ineffective tissue perfusion will decrease Outcome: Progressing SQ heparin  Problem: Cardiac: Goal: Ability to achieve and maintain adequate cardiopulmonary perfusion will improve Outcome: Progressing IV Cardizem

## 2015-03-31 LAB — BASIC METABOLIC PANEL
Anion gap: 11 (ref 5–15)
BUN: 37 mg/dL — ABNORMAL HIGH (ref 6–20)
CHLORIDE: 109 mmol/L (ref 101–111)
CO2: 24 mmol/L (ref 22–32)
Calcium: 8.1 mg/dL — ABNORMAL LOW (ref 8.9–10.3)
Creatinine, Ser: 1.97 mg/dL — ABNORMAL HIGH (ref 0.44–1.00)
GFR calc non Af Amer: 22 mL/min — ABNORMAL LOW (ref >60)
GFR, EST AFRICAN AMERICAN: 25 mL/min — AB (ref >60)
Glucose, Bld: 143 mg/dL — ABNORMAL HIGH (ref 65–99)
POTASSIUM: 3.8 mmol/L (ref 3.5–5.1)
SODIUM: 144 mmol/L (ref 135–145)

## 2015-03-31 MED ORDER — METOPROLOL TARTRATE 50 MG PO TABS
50.0000 mg | ORAL_TABLET | Freq: Two times a day (BID) | ORAL | Status: DC
  Administered 2015-03-31 – 2015-04-02 (×5): 50 mg via ORAL
  Filled 2015-03-31 (×5): qty 1

## 2015-03-31 MED ORDER — IPRATROPIUM-ALBUTEROL 0.5-2.5 (3) MG/3ML IN SOLN: 3.0000 mL | Freq: Four times a day (QID) | RESPIRATORY_TRACT | Status: DC | PRN

## 2015-03-31 NOTE — Consult Note (Signed)
[facility] Consultation Note  Patient ID: Holly Burns, MRN: [id number], DOB/AGE: 1924-05-15 81 y.o. Admit date: 03/29/2015   Date of Consult: 03/31/2015 Primary Physician: Margarita Rana, MD Primary Cardiologist: None  Chief Complaint:  Chief Complaint  Patient presents with  . Chills   Reason for Consult: atrial fibrillation with rapid ventricular rate  HPI: 81 y.o. female with known chronic kidney disease stage IV essential hypertension and mixed hyperlipidemia who is had new onset of significant shortness of breath weakness and fatigue as well as cough and congestion with possible bronchitis. The patient had progression of this to the point where she was significantly short of breath and needed to be seen in the emergency room. At that time the patient had also atrial fibrillation with rapid ventricular rate as well as an elevated troponin of 0.33 more consistent with demand ischemia rather than acute coronary syndrome. The patient was placed on appropriate medication management and currently has had better heart rate control. The patient has had no evidence of strokelike symptoms or congestive heart failure or other anginal symptoms consistent with myocardial infarction. The patient is breathing much better today and is feeling better. The patient has had a previous coronary artery disease as well with apparent mild LV systolic dysfunction and intervention in the past  Past Medical History  Diagnosis Date  . CHF (congestive heart failure) ([place])   . Depression   . GERD (gastroesophageal reflux disease)   . Hyperlipidemia   . Hypertension   . Thyroid disease       Surgical History:  Past Surgical History  Procedure Laterality Date  . Angioplasty      due to MI LV ef-40%  . 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 Craniotomy    . Abdominal hysterectomy  1980  . Cataract surgery Left      Home  Meds: Prior to Admission medications   Medication Sig Start Date End Date Taking? Authorizing Provider  acetaminophen (TYLENOL) 500 MG tablet Take 500-1,000 mg by mouth every 6 (six) hours as needed.    Yes Historical Provider, MD  amLODipine (NORVASC) 5 MG tablet Take 2.5 mg by mouth daily.    Yes Historical Provider, MD  Cholecalciferol (VITAMIN D HIGH POTENCY) 1000 UNITS capsule Take 4,000 Units by mouth daily.  06/12/10  Yes Historical Provider, MD  Cranberry-Vitamin C-Inulin (UTI-STAT) LIQD Take 1 capsule by mouth 2 (two) times daily.  11/24/11  Yes Historical Provider, MD  donepezil (ARICEPT) 10 MG tablet Take 10 mg by mouth at bedtime.   Yes Historical Provider, MD  escitalopram (LEXAPRO) 10 MG tablet Take 10 mg by mouth daily.    Yes Historical Provider, MD  Ferrous Sulfate (IRON) 325 (65 FE) MG TABS Take 1 tablet by mouth 2 (two) times daily.    Yes Historical Provider, MD  furosemide (LASIX) 20 MG tablet Take 1 tablet (20 mg total) by mouth 2 (two) times daily. 01/29/15  Yes Margarita Rana, MD  levothyroxine (SYNTHROID) 75 MCG tablet Take 1 tablet (75 mcg total) by mouth daily before breakfast. 12/05/14  Yes Margarita Rana, MD  LORazepam (ATIVAN) 1 MG tablet Take 1 tablet (1 mg total) by mouth every 4 (four) hours as needed for anxiety. Patient taking differently: Take 0.5-1 mg by mouth every 4 (four) hours as needed for anxiety.  03/03/15  Yes Margarita Rana, MD  memantine (NAMENDA) 10 MG tablet Take 10  mg by mouth 2 (two) times daily.  01/29/11  Yes Historical Provider, MD  metoprolol succinate (TOPROL-XL) 25 MG 24 hr tablet Take 1 tablet (25 mg total) by mouth daily. 03/03/15  Yes Margarita Rana, MD  omeprazole (PRILOSEC) 20 MG capsule Take 20 mg by mouth daily.    Yes Historical Provider, MD  potassium chloride (MICRO-K) 10 MEQ CR capsule Take 1 capsule (10 mEq total) by mouth daily. 11/07/14  Yes Margarita Rana, MD  ramipril (ALTACE) 10 MG capsule Take 1 capsule (10 mg total) by mouth daily.  03/03/15  Yes Margarita Rana, MD  simvastatin (ZOCOR) 20 MG tablet Take 1 tablet (20 mg total) by mouth at bedtime. 01/10/15  Yes Margarita Rana, MD  vitamin C (ASCORBIC ACID) 500 MG tablet Take 1,000 mg by mouth daily.    Yes Historical Provider, MD  metoprolol succinate (TOPROL-XL) 25 MG 24 hr tablet TAKE 1 TABLET DAILY. Patient not taking: Reported on 03/29/2015 03/05/15   Margarita Rana, MD  ramipril (ALTACE) 10 MG capsule 1 capsule Capsule, Oral, daily Patient not taking: Reported on 03/29/2015 03/05/15   Margarita Rana, MD    Inpatient Medications:  . azithromycin  250 mg Oral Daily  . budesonide (PULMICORT) nebulizer solution  0.25 mg Nebulization BID  . cholecalciferol  4,000 Units Oral Daily  . diltiazem  30 mg Oral 4 times per day  . docusate sodium  100 mg Oral BID  . donepezil  10 mg Oral QHS  . escitalopram  10 mg Oral Daily  . ferrous sulfate  325 mg Oral BID  . furosemide  20 mg Oral BID  . heparin  5,000 Units Subcutaneous 3 times per day  . levothyroxine  75 mcg Oral QAC breakfast  . memantine  10 mg Oral BID  . metoprolol succinate  25 mg Oral Daily  . potassium chloride  10 mEq Oral Daily  . predniSONE  40 mg Oral Q breakfast   Followed by  . [START ON 04/01/2015] predniSONE  30 mg Oral Q breakfast   Followed by  . [START ON 04/02/2015] predniSONE  20 mg Oral Q breakfast   Followed by  . [START ON 04/03/2015] predniSONE  10 mg Oral Q breakfast   Followed by  . [START ON 04/04/2015] predniSONE  5 mg Oral Q breakfast  . simvastatin  20 mg Oral QHS  . sodium chloride  3 mL Intravenous Q12H  . vitamin C  1,000 mg Oral Daily      Allergies:  Allergies  Allergen Reactions  . Codeine Other (See Comments)    Reaction: unknown  . Penicillins Other (See Comments)    Unknown reaction and unable to answer follow-up questions  . Phenytoin Sodium Extended Other (See Comments)    Hyperthermia    Social History   Social History  . Marital Status: Widowed    Spouse  Name: N/A  . Number of Children: 2  . Years of Education: H/S   Occupational History  . Retired    Social History Main Topics  . Smoking status: Never Smoker   . Smokeless tobacco: Never Used  . Alcohol Use: No  . Drug Use: No  . Sexual Activity: Not on file   Other Topics Concern  . Not on file   Social History Narrative     Family History  Problem Relation Age of Onset  . Hypertension Mother   . Cerebrovascular Accident Mother   . Heart disease Father      Review  of Systems Positive for shortness of breath cough and congestion Negative for: General:  chills, fever, night sweats or weight changes.  Cardiovascular: PND orthopnea syncope dizziness  Dermatological skin lesions rashes Respiratory: Positive for Cough congestion Urologic: Frequent urination urination at night and hematuria Abdominal: negative for nausea, vomiting, diarrhea, bright red blood per rectum, melena, or hematemesis Neurologic: negative for visual changes, and/or hearing changes  All other systems reviewed and are otherwise negative except as noted above.  Labs:  Recent Labs  03/29/15 2129 03/30/15 0416 03/30/15 1009  TROPONINI 0.21* 0.33* 0.32*   Lab Results  Component Value Date   WBC 14.0* 03/29/2015   HGB 12.9 03/29/2015   HCT 40.2 03/29/2015   MCV 96.6 03/29/2015   PLT 300 03/29/2015    Recent Labs Lab 03/29/15 2129 03/31/15 0354  NA 143 144  K 3.6 3.8  CL 107 109  CO2 23 24  BUN 33* 37*  CREATININE 2.01* 1.97*  CALCIUM 9.0 8.1*  PROT 7.5  --   BILITOT 1.1  --   ALKPHOS 76  --   ALT 45  --   AST 34  --   GLUCOSE 191* 143*   Lab Results  Component Value Date   CHOL 178 11/27/2014   HDL 44 11/27/2014   LDLCALC 77 11/27/2014   TRIG 283* 11/27/2014   No results found for: DDIMER  Radiology/Studies:  Dg Chest Portable 1 View  03/29/2015  CLINICAL DATA:  Cough. Patient was found this evening on the floor in front of her bed. Possible fall. History of CHF and  hypertension. EXAM: PORTABLE CHEST 1 VIEW COMPARISON:  None. FINDINGS: Normal heart size and pulmonary vascularity. Mild interstitial changes in the lungs with peribronchial thickening suggesting chronic bronchitis. No focal airspace disease or consolidation in the lungs. No blunting of costophrenic angles. No pneumothorax. Calcified and tortuous aorta. Postoperative changes with left mastectomy and surgical clips in the left axilla. IMPRESSION: Probable chronic bronchitic changes in the lungs. No evidence of active pulmonary disease. Electronically Signed   By: Lucienne Capers M.D.   On: 03/29/2015 22:13    EKG: Atrial fibrillation with rapid ventricular rate  Weights: Filed Weights   03/30/15 0157 03/31/15 0403 03/31/15 0510  Weight: 119 lb 11.2 oz (54.296 kg) 126 lb 14.4 oz (57.561 kg) 118 lb 1.6 oz (53.57 kg)     Physical Exam: Blood pressure 137/63, pulse 103, temperature 97.8 F (36.6 C), temperature source Oral, resp. rate 16, height 5\' 2"  (1.575 m), weight 118 lb 1.6 oz (53.57 kg), SpO2 93 %. Body mass index is 21.6 kg/(m^2). General: Well developed, well nourished, in no acute distress. Head eyes ears nose throat: Normocephalic, atraumatic, sclera non-icteric, no xanthomas, nares are without discharge. No apparent thyromegaly and/or mass  Lungs: Normal respiratory effort.  no wheezes, no rales, diffuse rhonchi.  Heart: Irregular with normal S1 S2. no murmur gallop, no rub, PMI is normal size and placement, carotid upstroke normal without bruit, jugular venous pressure is normal Abdomen: Soft, non-tender, non-distended with normoactive bowel sounds. No hepatomegaly. No rebound/guarding. No obvious abdominal masses. Abdominal aorta is normal size without bruit Extremities: Trace edema. no cyanosis, no clubbing, no ulcers  Peripheral : 2+ bilateral upper extremity pulses, 2+ bilateral femoral pulses, 2+ bilateral dorsal pedal pulse Neuro: Alert and oriented. No facial asymmetry. No focal  deficit. Moves all extremities spontaneously. Musculoskeletal: Normal muscle tone without kyphosis Psych:  Responds to questions appropriately with a normal affect.    Assessment: 82 year old female  with previous coronary artery disease essential hypertension mixed hyperlipidemia chronic kidney disease stage IV with acute onset of bronchitis likely causing atrial fibrillation with rapid ventricular rate and elevated troponin without evidence of heart failure or myocardial infarction or acute coronary syndrome  Plan: 1. Continue diltiazem and changed to longer acting oral diltiazem for better heart rate control and possible spontaneous conversion to normal sinus rhythm 2. Anticoagulation with heparin if able while patient is having atrial fibrillation and would consider oral anticoagulation if patient does not spontaneously convert to normal sinus rhythm 3. Echocardiogram for LV systolic dysfunction valvular heart disease continue intervening to above 4. Begin ambulation and follow for need in adjustments of dosage of medication 5. Treatment of infection 6. No further intervention of elevated troponin consistent with demand ischemia 7. Further treatment options after above  Signed, Corey Skains M.D. [facility] 03/31/2015, 8:22 AM

## 2015-03-31 NOTE — Progress Notes (Signed)
[name] NAME: Holly Burns    MR#:  [id number]  DATE OF BIRTH:  1924/11/28  SUBJECTIVE:  Patient here due to shortness of breath due to acute bronchitis and also noted to be in rapid atrial fibrillation. Heart rates have significantly improved and now in normal sinus rhythm. Shortness of breath also much improved.  REVIEW OF SYSTEMS:    Review of Systems  Constitutional: Negative for fever and chills.  HENT: Negative for congestion and tinnitus.   Eyes: Negative for blurred vision and double vision.  Respiratory: Negative for cough, shortness of breath and wheezing.   Cardiovascular: Negative for chest pain, orthopnea and PND.  Gastrointestinal: Negative for nausea, vomiting, abdominal pain and diarrhea.  Genitourinary: Negative for dysuria and hematuria.  Neurological: Negative for dizziness, sensory change and focal weakness.  All other systems reviewed and are negative.   Nutrition: Heart healthy Tolerating Diet: Yes  Tolerating PT: Ambulatory  DRUG ALLERGIES:   Allergies  Allergen Reactions  . Codeine Other (See Comments)    Reaction: unknown  . Penicillins Other (See Comments)    Unknown reaction and unable to answer follow-up questions  . Phenytoin Sodium Extended Other (See Comments)    Hyperthermia    VITALS:  Blood pressure 133/54, pulse 72, temperature 97.8 F (36.6 C), temperature source Oral, resp. rate 19, height 5\' 2"  (1.575 m), weight 53.57 kg (118 lb 1.6 oz), SpO2 92 %.  PHYSICAL EXAMINATION:   Physical Exam  GENERAL:  81 y.o.-year-old patient lying in the bed with no acute distress.  EYES: Pupils equal, round, reactive to light and accommodation. No scleral icterus. Extraocular muscles intact.  HEENT: Head atraumatic, normocephalic. Oropharynx and nasopharynx clear.  NECK:  Supple, no jugular venous distention. No thyroid enlargement, no tenderness.  LUNGS: Normal breath sounds  bilaterally, no wheezing, rales, rhonchi. No use of accessory muscles of respiration.  CARDIOVASCULAR: S1, S2 normal. No murmurs, rubs, or gallops.  ABDOMEN: Soft, nontender, nondistended. Bowel sounds present. No organomegaly or mass.  EXTREMITIES: No cyanosis, clubbing or edema b/l.    NEUROLOGIC: Cranial nerves II through XII are intact. No focal Motor or sensory deficits b/l.   PSYCHIATRIC: The patient is alert and oriented x 3.  SKIN: No obvious rash, lesion, or ulcer.    LABORATORY PANEL:   CBC  Recent Labs Lab 03/29/15 2129  WBC 14.0*  HGB 12.9  HCT 40.2  PLT 300   ------------------------------------------------------------------------------------------------------------------  Chemistries   Recent Labs Lab 03/29/15 2129 03/31/15 0354  NA 143 144  K 3.6 3.8  CL 107 109  CO2 23 24  GLUCOSE 191* 143*  BUN 33* 37*  CREATININE 2.01* 1.97*  CALCIUM 9.0 8.1*  AST 34  --   ALT 45  --   ALKPHOS 76  --   BILITOT 1.1  --    ------------------------------------------------------------------------------------------------------------------  Cardiac Enzymes  Recent Labs Lab 03/30/15 1009  TROPONINI 0.32*   ------------------------------------------------------------------------------------------------------------------  RADIOLOGY:  Dg Chest Portable 1 View  03/29/2015  CLINICAL DATA:  Cough. Patient was found this evening on the floor in front of her bed. Possible fall. History of CHF and hypertension. EXAM: PORTABLE CHEST 1 VIEW COMPARISON:  None. FINDINGS: Normal heart size and pulmonary vascularity. Mild interstitial changes in the lungs with peribronchial thickening suggesting chronic bronchitis. No focal airspace disease or consolidation in the lungs. No blunting of costophrenic angles. No pneumothorax. Calcified and tortuous aorta. Postoperative changes with left mastectomy and surgical  clips in the left axilla. IMPRESSION: Probable chronic bronchitic changes  in the lungs. No evidence of active pulmonary disease. Electronically Signed   By: Lucienne Capers M.D.   On: 03/29/2015 22:13     ASSESSMENT AND PLAN:   81 year old female with past medical history of dementia, depression, history of CHF, hypothyroidism, hyperlipidemia, GERD who presented to the hospital due to shortness of breath as also noted to be in atrial fibrillation.  #1 acute bronchitis-likely the cause of patient's shortness of breath and cough. Clinically much improved since yesterday. -Continue prednisone taper, Zithromax, budesonide nebs.  #2 afibrillation with rapid ventricular response-patient was on a Cardizem drip and now has been weaned off of it. -Patient now converted to normal sinus rhythm. This was likely secondary to underlying respiratory illness. -Continue oral beta blocker. Echo results noted to be consistent with mild LV dysfunction with EF of 35-40%. Burnis Medin discuss with cardiology about plans for long-term anticoagulation.  #3 dementia-continue Aricept, Namenda.  #4 hypothyroidism-continue Synthroid.  #5 depression-continue Lexapro.  #6 history of CHF-clinically patient is not in congestive heart failure. Continue Lasix, beta blocker.   All the records are reviewed and case discussed with Care Management/Social Workerr. Management plans discussed with the patient, family and they are in agreement.  CODE STATUS: Full  DVT Prophylaxis: Heparin subcutaneous  TOTAL TIME TAKING CARE OF THIS PATIENT: 30 minutes.   POSSIBLE D/C IN 1-2 DAYS, DEPENDING ON CLINICAL CONDITION.   Henreitta Leber M.D on 03/31/2015 at 4:31 PM  Between 7am to 6pm - Pager - [phone]  After 6pm go to [email or web address] - password [email or web address] [facility]  [facility]  Office  [phone]  CC: Primary care physician; Margarita Rana, MD

## 2015-03-31 NOTE — Progress Notes (Addendum)
At midnight vital check pt was irritable saying she did not have to take a pill at night time.  Pt says "something is going on here, ...you all think I'm crazy."  RN educated pt on cardizem and the importance of her taking a midnight dose.  Pt said she didn't care if her heart rate went up, if it did she could go meet the [place] and be with her husband.  Pt reassured we did not want her HR to go up and we are monitoring her HR at the desk.  Pt informed she would not have to take that medicine at midnight at home, but bc she was taken off her drip today she needed extra medication.  Pt took pill but said "if I take another pill, I'm going to turn in to a pill" Pt left alone to rest, bed alarm in place. Jessee Avers

## 2015-03-31 NOTE — Progress Notes (Signed)
Pts mood changed significantly since midnight.  Pt very pleasant and thanked staff for her care. Jessee Avers

## 2015-04-01 LAB — CBC
HCT: 37.6 % (ref 35.0–47.0)
HEMOGLOBIN: 12.3 g/dL (ref 12.0–16.0)
MCH: 30.9 pg (ref 26.0–34.0)
MCHC: 32.6 g/dL (ref 32.0–36.0)
MCV: 94.8 fL (ref 80.0–100.0)
Platelets: 384 10*3/uL (ref 150–440)
RBC: 3.96 MIL/uL (ref 3.80–5.20)
RDW: 13.9 % (ref 11.5–14.5)
WBC: 18.8 10*3/uL — ABNORMAL HIGH (ref 3.6–11.0)

## 2015-04-01 LAB — BASIC METABOLIC PANEL
ANION GAP: 9 (ref 5–15)
BUN: 39 mg/dL — ABNORMAL HIGH (ref 6–20)
CHLORIDE: 110 mmol/L (ref 101–111)
CO2: 24 mmol/L (ref 22–32)
CREATININE: 1.8 mg/dL — AB (ref 0.44–1.00)
Calcium: 7.9 mg/dL — ABNORMAL LOW (ref 8.9–10.3)
GFR calc non Af Amer: 24 mL/min — ABNORMAL LOW (ref >60)
GFR, EST AFRICAN AMERICAN: 28 mL/min — AB (ref >60)
GLUCOSE: 108 mg/dL — AB (ref 65–99)
Potassium: 2.9 mmol/L — CL (ref 3.5–5.1)
Sodium: 143 mmol/L (ref 135–145)

## 2015-04-01 LAB — MAGNESIUM: Magnesium: 1.9 mg/dL (ref 1.7–2.4)

## 2015-04-01 MED ORDER — POTASSIUM CHLORIDE CRYS ER 20 MEQ PO TBCR
20.0000 meq | EXTENDED_RELEASE_TABLET | Freq: Two times a day (BID) | ORAL | Status: DC
  Administered 2015-04-01 – 2015-04-02 (×2): 20 meq via ORAL
  Filled 2015-04-01 (×2): qty 1

## 2015-04-01 MED ORDER — APIXABAN 2.5 MG PO TABS
2.5000 mg | ORAL_TABLET | Freq: Two times a day (BID) | ORAL | Status: DC
  Administered 2015-04-01 – 2015-04-02 (×3): 2.5 mg via ORAL
  Filled 2015-04-01 (×3): qty 1

## 2015-04-01 MED ORDER — POTASSIUM CHLORIDE CRYS ER 20 MEQ PO TBCR
40.0000 meq | EXTENDED_RELEASE_TABLET | Freq: Once | ORAL | Status: AC
  Administered 2015-04-01: 40 meq via ORAL
  Filled 2015-04-01 (×2): qty 2

## 2015-04-01 MED ORDER — RAMIPRIL 5 MG PO CAPS
5.0000 mg | ORAL_CAPSULE | Freq: Every day | ORAL | Status: DC
  Administered 2015-04-01 – 2015-04-02 (×2): 5 mg via ORAL
  Filled 2015-04-01 (×2): qty 1

## 2015-04-01 MED ORDER — DILTIAZEM HCL ER COATED BEADS 240 MG PO CP24
240.0000 mg | ORAL_CAPSULE | Freq: Every day | ORAL | Status: DC
  Administered 2015-04-01 – 2015-04-02 (×2): 240 mg via ORAL
  Filled 2015-04-01 (×2): qty 1

## 2015-04-01 NOTE — Progress Notes (Addendum)
[facility] NAME: Holly Burns    MR#:  [id number]  DATE OF BIRTH:  01-07-1925  SUBJECTIVE:  Patient here due to shortness of breath due to acute bronchitis and also noted to be in rapid atrial fibrillation. Patient's heart rates are still labile but patient's shortness of breath has improved. Family at bedside.  REVIEW OF SYSTEMS:    Review of Systems  Constitutional: Negative for fever and chills.  HENT: Negative for congestion and tinnitus.   Eyes: Negative for blurred vision and double vision.  Respiratory: Negative for cough, shortness of breath and wheezing.   Cardiovascular: Negative for chest pain, orthopnea and PND.  Gastrointestinal: Negative for nausea, vomiting, abdominal pain and diarrhea.  Genitourinary: Negative for dysuria and hematuria.  Neurological: Negative for dizziness, sensory change and focal weakness.  All other systems reviewed and are negative.   Nutrition: Heart healthy Tolerating Diet: Yes  Tolerating PT: Evaluation noted.  DRUG ALLERGIES:   Allergies  Allergen Reactions  . Codeine Other (See Comments)    Reaction: unknown  . Penicillins Other (See Comments)    Unknown reaction and unable to answer follow-up questions  . Phenytoin Sodium Extended Other (See Comments)    Hyperthermia    VITALS:  Blood pressure 153/81, pulse 102, temperature 97.5 F (36.4 C), temperature source Oral, resp. rate 20, height 5\' 2"  (1.575 m), weight 56.019 kg (123 lb 8 oz), SpO2 98 %.  PHYSICAL EXAMINATION:   Physical Exam  GENERAL:  81 y.o.-year-old patient lying in the bed with no acute distress.  EYES: Pupils equal, round, reactive to light and accommodation. No scleral icterus. Extraocular muscles intact.  HEENT: Head atraumatic, normocephalic. Oropharynx and nasopharynx clear.  NECK:  Supple, no jugular venous distention. No thyroid enlargement, no tenderness.  LUNGS: Normal breath sounds  bilaterally, no wheezing, rales, rhonchi. No use of accessory muscles of respiration.  CARDIOVASCULAR: S1, S2 Irregular. No murmurs, rubs, or gallops.  ABDOMEN: Soft, nontender, nondistended. Bowel sounds present. No organomegaly or mass.  EXTREMITIES: No cyanosis, clubbing or edema b/l.    NEUROLOGIC: Cranial nerves II through XII are intact. No focal Motor or sensory deficits b/l.   PSYCHIATRIC: The patient is alert and oriented x 3.  SKIN: No obvious rash, lesion, or ulcer.    LABORATORY PANEL:   CBC  Recent Labs Lab 04/01/15 0642  WBC 18.8*  HGB 12.3  HCT 37.6  PLT 384   ------------------------------------------------------------------------------------------------------------------  Chemistries   Recent Labs Lab 03/29/15 2129  04/01/15 0642  NA 143  < > 143  K 3.6  < > 2.9*  CL 107  < > 110  CO2 23  < > 24  GLUCOSE 191*  < > 108*  BUN 33*  < > 39*  CREATININE 2.01*  < > 1.80*  CALCIUM 9.0  < > 7.9*  MG  --   --  1.9  AST 34  --   --   ALT 45  --   --   ALKPHOS 76  --   --   BILITOT 1.1  --   --   < > = values in this interval not displayed. ------------------------------------------------------------------------------------------------------------------  Cardiac Enzymes  Recent Labs Lab 03/30/15 1009  TROPONINI 0.32*   ------------------------------------------------------------------------------------------------------------------  RADIOLOGY:  No results found.   ASSESSMENT AND PLAN:   81 year old female with past medical history of dementia, depression, history of CHF, hypothyroidism, hyperlipidemia, GERD who presented to the hospital due to  shortness of breath as also noted to be in atrial fibrillation.  #1 acute bronchitis-likely the cause of patient's shortness of breath and cough. Clinically much improved since yesterday. -Continue prednisone taper, Zithromax, budesonide nebs.  #2 afibrillation with rapid ventricular response-patient was on  a Cardizem drip and now has been weaned off of it. -Heart rates still quite labile. I have added oral Cardizem, continue oral metoprolol. -Discussed with cardiology and they recommend long-term anticoagulation and therefore we'll start Eliquis today.  -Echo results noted to be consistent with mild LV dysfunction with EF of 35-40% which maybe chronic.  #3 dementia-continue Aricept, Namenda.  #4 hypothyroidism-continue Synthroid.  #5 depression-continue Lexapro.  #6 history of CHF-chronic systolic. clinically patient is not in congestive heart failure. Continue Lasix, beta blocker, resume Ramipril.  #7 hypokalemia-patient has been started on oral supplements. We'll repeat level in the morning. Magnesium level normal.  We will get physical therapy consult and evaluation noted.   All the records are reviewed and case discussed with Care Management/Social Workerr. Management plans discussed with the patient, family and they are in agreement.  CODE STATUS: Full  DVT Prophylaxis: Heparin subcutaneous  TOTAL TIME TAKING CARE OF THIS PATIENT: 30 minutes.   POSSIBLE D/C IN 1-2 DAYS, DEPENDING ON CLINICAL CONDITION.   Henreitta Leber M.D on 04/01/2015 at 3:08 PM  Between 7am to 6pm - Pager - [phone]  After 6pm go to [email or web address] - password [email or web address] [facility]  [facility]  Office  [phone]  CC: Primary care physician; Margarita Rana, MD

## 2015-04-01 NOTE — Progress Notes (Signed)
[facility] Encounter Note  Patient: Holly Burns / Admit Date: 03/29/2015 / Date of Encounter: 04/01/2015, 9:22 AM   Subjective: Mild shortness of breath and no evidence of chest discomfort but continued rapid heartbeat  Review of Systems: Positive for: Shortness of breath Negative for: Vision change, hearing change, syncope, dizziness, nausea, vomiting,diarrhea, bloody stool, stomach pain, cough, congestion, diaphoresis, urinary frequency, urinary pain,skin lesions, skin rashes Others previously listed  Objective: Telemetry: Atrial fibrillation with rapid ventricular rate Physical Exam: Blood pressure 160/75, pulse 89, temperature 97.7 F (36.5 C), temperature source Oral, resp. rate 20, height 5\' 2"  (1.575 m), weight 123 lb 8 oz (56.019 kg), SpO2 96 %. Body mass index is 22.58 kg/(m^2). General: Well developed, well nourished, in no acute distress. Head: Normocephalic, atraumatic, sclera non-icteric, no xanthomas, nares are without discharge. Neck: No apparent masses Lungs: Normal respirations with no wheezes, positive rhonchi, no rales , few crackles   Heart: Irregular rate and rhythm, normal S1 S2, no murmur, no rub, no gallop, PMI is normal size and placement, carotid upstroke normal without bruit, jugular venous pressure normal Abdomen: Soft, non-tender, non-distended with normoactive bowel sounds. No hepatosplenomegaly. Abdominal aorta is normal size without bruit Extremities: Trace to 1+ edema, no clubbing, no cyanosis, no ulcers,  Peripheral: 2+ radial, 2+ femoral, 2+ dorsal pedal pulses Neuro: Alert and oriented. Moves all extremities spontaneously. Psych:  Responds to questions appropriately with a normal affect.   Intake/Output Summary (Last 24 hours) at 04/01/15 [id number] Last data filed at 04/01/15 [id number]  Gross per 24 hour  Intake    240 ml  Output    200 ml  Net     40 ml    Inpatient Medications:  . apixaban  2.5 mg Oral BID  .  azithromycin  250 mg Oral Daily  . budesonide (PULMICORT) nebulizer solution  0.25 mg Nebulization BID  . cholecalciferol  4,000 Units Oral Daily  . diltiazem  240 mg Oral Daily  . docusate sodium  100 mg Oral BID  . donepezil  10 mg Oral QHS  . escitalopram  10 mg Oral Daily  . ferrous sulfate  325 mg Oral BID  . furosemide  20 mg Oral BID  . heparin  5,000 Units Subcutaneous 3 times per day  . levothyroxine  75 mcg Oral QAC breakfast  . memantine  10 mg Oral BID  . metoprolol tartrate  50 mg Oral BID  . potassium chloride  20 mEq Oral BID  . potassium chloride  40 mEq Oral Once  . [START ON 04/02/2015] predniSONE  20 mg Oral Q breakfast   Followed by  . [START ON 04/03/2015] predniSONE  10 mg Oral Q breakfast   Followed by  . [START ON 04/04/2015] predniSONE  5 mg Oral Q breakfast  . ramipril  5 mg Oral Daily  . simvastatin  20 mg Oral QHS  . sodium chloride  3 mL Intravenous Q12H  . vitamin C  1,000 mg Oral Daily   Infusions:    Labs:  Recent Labs  03/31/15 0354 04/01/15 0642  NA 144 143  K 3.8 2.9*  CL 109 110  CO2 24 24  GLUCOSE 143* 108*  BUN 37* 39*  CREATININE 1.97* 1.80*  CALCIUM 8.1* 7.9*  MG  --  1.9    Recent Labs  03/29/15 2129  AST 34  ALT 45  ALKPHOS 76  BILITOT 1.1  PROT 7.5  ALBUMIN 3.9    Recent Labs  03/29/15  2129 04/01/15 0642  WBC 14.0* 18.8*  HGB 12.9 12.3  HCT 40.2 37.6  MCV 96.6 94.8  PLT 300 384    Recent Labs  03/29/15 2129 03/30/15 0416 03/30/15 1009  TROPONINI 0.21* 0.33* 0.32*   Invalid input(s): POCBNP No results for input(s): HGBA1C in the last 72 hours.   Weights: Filed Weights   03/31/15 0403 03/31/15 0510 04/01/15 0658  Weight: 126 lb 14.4 oz (57.561 kg) 118 lb 1.6 oz (53.57 kg) 123 lb 8 oz (56.019 kg)     Radiology/Studies:  Dg Chest Portable 1 View  03/29/2015  CLINICAL DATA:  Cough. Patient was found this evening on the floor in front of her bed. Possible fall. History of CHF and hypertension.  EXAM: PORTABLE CHEST 1 VIEW COMPARISON:  None. FINDINGS: Normal heart size and pulmonary vascularity. Mild interstitial changes in the lungs with peribronchial thickening suggesting chronic bronchitis. No focal airspace disease or consolidation in the lungs. No blunting of costophrenic angles. No pneumothorax. Calcified and tortuous aorta. Postoperative changes with left mastectomy and surgical clips in the left axilla. IMPRESSION: Probable chronic bronchitic changes in the lungs. No evidence of active pulmonary disease. Electronically Signed   By: Lucienne Capers M.D.   On: 03/29/2015 22:13     Assessment and Recommendation  81 y.o. female with essential hypertension chronic kidney disease stage IV mixed hyperlipidemia with bronchitis and/or upper respiratory tract infection likely exacerbating and causing new-onset nonvalvular atrial fibrillation with rapid ventricular rate and elevated troponin consistent with demand ischemia without evidence of acute coronary syndrome 1. Dish in of diltiazem long-acting 240 mg each day to help with heart rate control of atrial fibrillation with a goal heart rate below 100 bpm at rest 2. Continue metoprolol for LV systolic dysfunction and heart rate control 3. Begin anticoagulation due to significant high risk of stroke with continued atrial fibrillation using apixaban 2.5 mg twice per day 4. Begin ambulation and follow for need in adjustments of medication with outpatient adjustments as necessary in one week  Signed, Serafina Royals M.D. FACC

## 2015-04-01 NOTE — Evaluation (Signed)
Physical Therapy Evaluation Patient Details Name: Holly Burns MRN: [id number] DOB: 05-12-1924 Today's Date: 04/01/2015   History of Present Illness  The patient presented emergency department complaining of a cough reporting scant amounts of phlegm to the emergency department physician. She uses O2 at night only at home. Chest x-ray showed chronic changes without infiltrate. However the patient does have a mild leukocytosis. She was also found to have atrial fibrillation with rapid ventricular rate as well as associated increase in troponin. The patient denies shortness of breath or chest pain. She also denies fevers but admits to feeling "clammy" and "hot all over". The above constellation of symptoms prompted the emergency department staff to call for admission. Pt was admitted for acute bronchitis as well as a-fib with RVR. She had elevated troponin but it was determined to be most consistent with demand ischemia. At baseline pt lives in independent living apartment and cares for ADLs independently. She receives assistance from family with IADLs. Pt ambulates around apartment without assistive device. She reports one fall recently but it did not result in injury. Otherwise, no reported falls in the last 12 months.   Clinical Impression  Pt is relatively safe with bed mobility, transfers, and ambulation with use of rolling walker. Without use of walker she is considerably less safe with increased balance deficits and higher risk for falls. She has some cognitive impairments and overall she appears to be in need of increased caregiver support. Discussed with family and notified them that discharge planning team could provide information about private caregivers vs ALF. She does not need SNF placement but in general is at increased risk for falls. Also concerned about patient's insight and judgement. Pt would benefit from [facility] PT to work on balance and strengthening as well as safety strategies at  home. Pt will benefit from skilled PT services to address deficits in strength, balance, and mobility in order to return to full function at home.      Follow Up Recommendations Home health PT;Supervision - Intermittent    Equipment Recommendations  Rolling walker with 5" wheels (Has standard walker. Needs rolling walker)    Recommendations for Other Services       Precautions / Restrictions Precautions Precautions: Fall Restrictions Weight Bearing Restrictions: No      Mobility  Bed Mobility Overal bed mobility: Independent             General bed mobility comments: Pt demonstrates good sequencing and strength  Transfers Overall transfer level: Needs assistance Equipment used: Rolling walker (2 wheeled) Transfers: Sit to/from Stand Sit to Stand: Min guard         General transfer comment: Decreased LE strength/power. Overall stable without evidence for imbalance  Ambulation/Gait Ambulation/Gait assistance: Min guard Ambulation Distance (Feet): 270 Feet Assistive device: Rolling walker (2 wheeled) Gait Pattern/deviations: Step-through pattern Gait velocity: Decreased Gait velocity interpretation: Below normal speed for age/gender General Gait Details: Decreased gait speed noted. Mild to moderate gait deviations with head turning during ambulation. Vitals monitored during ambulation and SaO2 remains >90% on room air throughout. HR is between 110-120 bpm during ambulation. Pt denies DOE. Transitioned pt to no assistive device with decreased in gait speed and step length as well as increased instability  Stairs            Wheelchair Mobility    Modified Rankin (Stroke Patients Only)       Balance Overall balance assessment: Needs assistance Sitting-balance support: No upper extremity supported Sitting balance-Leahy Scale: Good  Standing balance-Leahy Scale: Fair Standing balance comment: Pt unable to achieve narrow stance without UE support  due to LOB. Unable to perform Rhomberg testing due to poor balance                             Pertinent Vitals/Pain Pain Assessment: No/denies pain    Home Living Family/patient expects to be discharged to:: Private residence Living Arrangements: Alone Available Help at Discharge: Family Type of Home: Apartment Home Access: Level entry     Home Layout: One level Home Equipment: Walker - standard;Cane - quad;Grab bars - tub/shower;Grab bars - toilet;Shower seat (no BSC)      Prior Function Level of Independence: Independent         Comments: Assist for IADLs     Hand Dominance   Dominant Hand: Right    Extremity/Trunk Assessment   Upper Extremity Assessment: Overall WFL for tasks assessed           Lower Extremity Assessment: Overall WFL for tasks assessed         Communication   Communication: No difficulties  Cognition Arousal/Alertness: Awake/alert Behavior During Therapy: WFL for tasks assessed/performed Overall Cognitive Status: History of cognitive impairments - at baseline Area of Impairment: Orientation Orientation Level: Disoriented to;Time;Situation (Oriented to year, not month. prior holiday conversation )   Memory: Decreased short-term memory              General Comments      Exercises        Assessment/Plan    PT Assessment Patient needs continued PT services  PT Diagnosis Difficulty walking;Abnormality of gait;Generalized weakness   PT Problem List Decreased strength;Decreased activity tolerance;Decreased balance;Decreased mobility;Decreased cognition;Decreased knowledge of use of DME;Decreased safety awareness  PT Treatment Interventions DME instruction;Gait training;Stair training;Functional mobility training;Therapeutic activities;Therapeutic exercise;Balance training;Neuromuscular re-education;Cognitive remediation;Patient/family education   PT Goals (Current goals can be found in the Care Plan section) Acute  Rehab PT Goals Patient Stated Goal: "I'll be strong when I get home." Pt refers to dying at multiple times during visit. Pt would like to return to independent living apartment PT Goal Formulation: With patient/family Time For Goal Achievement: 04/15/15 Potential to Achieve Goals: Good    Frequency Min 2X/week   Barriers to discharge Decreased caregiver support Pt lives alone in apartment    Co-evaluation               End of Session Equipment Utilized During Treatment: Gait belt Activity Tolerance: Patient tolerated treatment well Patient left: in bed;with call bell/phone within reach;with bed alarm set;with family/visitor present           Time: [id number] PT Time Calculation (min) (ACUTE ONLY): 26 min   Charges:   PT Evaluation $Initial PT Evaluation Tier I: 1 Procedure PT Treatments $Gait Training: 8-22 mins   PT G Codes:       Lyndel Safe Vondell Sowell PT, DPT   Tamrah Victorino 04/01/2015, 1:42 PM

## 2015-04-01 NOTE — Consult Note (Signed)
ANTICOAGULATION CONSULT NOTE - Initial Consult  Pharmacy Consult for Apixaban Indication: atrial fibrillation  Allergies  Allergen Reactions  . Codeine Other (See Comments)    Reaction: unknown  . Penicillins Other (See Comments)    Unknown reaction and unable to answer follow-up questions  . Phenytoin Sodium Extended Other (See Comments)    Hyperthermia    Patient Measurements: Height: 5\' 2"  (157.5 cm) Weight: 123 lb 8 oz (56.019 kg) IBW/kg (Calculated) : 50.1  Vital Signs: Temp: 97.7 F (36.5 C) (12/18 0613) Temp Source: Oral (12/18 [id number]) BP: 160/75 mmHg (12/18 0852) Pulse Rate: 89 (12/18 0852)  Labs:  Recent Labs  03/29/15 2129 03/30/15 0416 03/30/15 1009 03/31/15 0354 04/01/15 0642  HGB 12.9  --   --   --  12.3  HCT 40.2  --   --   --  37.6  PLT 300  --   --   --  384  CREATININE 2.01*  --   --  1.97* 1.80*  TROPONINI 0.21* 0.33* 0.32*  --   --     Estimated Creatinine Clearance: 17.1 mL/min (by C-G formula based on Cr of 1.8).   Medical History: Past Medical History  Diagnosis Date  . CHF (congestive heart failure) ([place])   . Depression   . GERD (gastroesophageal reflux disease)   . Hyperlipidemia   . Hypertension   . Thyroid disease     Medications:  Scheduled:  . apixaban  2.5 mg Oral BID  . azithromycin  250 mg Oral Daily  . budesonide (PULMICORT) nebulizer solution  0.25 mg Nebulization BID  . cholecalciferol  4,000 Units Oral Daily  . diltiazem  240 mg Oral Daily  . docusate sodium  100 mg Oral BID  . donepezil  10 mg Oral QHS  . escitalopram  10 mg Oral Daily  . ferrous sulfate  325 mg Oral BID  . furosemide  20 mg Oral BID  . heparin  5,000 Units Subcutaneous 3 times per day  . levothyroxine  75 mcg Oral QAC breakfast  . memantine  10 mg Oral BID  . metoprolol tartrate  50 mg Oral BID  . potassium chloride  20 mEq Oral BID  . potassium chloride  40 mEq Oral Once  . [START ON 04/02/2015] predniSONE  20 mg Oral Q breakfast   Followed by  . [START ON 04/03/2015] predniSONE  10 mg Oral Q breakfast   Followed by  . [START ON 04/04/2015] predniSONE  5 mg Oral Q breakfast  . ramipril  5 mg Oral Daily  . simvastatin  20 mg Oral QHS  . sodium chloride  3 mL Intravenous Q12H  . vitamin C  1,000 mg Oral Daily    Assessment: PB is an 81yo female admitted for Afib. Pharmacy consulted to dose apixaban in this patient for long term anticoagulation.  Goal of Therapy:  Monitor platelets by anticoagulation protocol: Yes  Plan:  Initiate apixaban 2.5mg  PO BID. Patient qualifies for dose reduction due to renal function, weight, and age.   Monitor for changes in renal function and CBC.   Pharmacy will continue to monitor.  Roe Coombs, PharmD Pharmacy Resident 04/01/2015

## 2015-04-02 LAB — BASIC METABOLIC PANEL
Anion gap: 8 (ref 5–15)
BUN: 38 mg/dL — AB (ref 6–20)
CHLORIDE: 108 mmol/L (ref 101–111)
CO2: 29 mmol/L (ref 22–32)
CREATININE: 1.6 mg/dL — AB (ref 0.44–1.00)
Calcium: 8.4 mg/dL — ABNORMAL LOW (ref 8.9–10.3)
GFR calc Af Amer: 32 mL/min — ABNORMAL LOW (ref >60)
GFR calc non Af Amer: 28 mL/min — ABNORMAL LOW (ref >60)
Glucose, Bld: 108 mg/dL — ABNORMAL HIGH (ref 65–99)
Potassium: 3.7 mmol/L (ref 3.5–5.1)
SODIUM: 145 mmol/L (ref 135–145)

## 2015-04-02 MED ORDER — PREDNISONE 10 MG PO TABS: ORAL_TABLET | ORAL | Status: DC

## 2015-04-02 MED ORDER — APIXABAN 2.5 MG PO TABS: 2.5000 mg | ORAL_TABLET | Freq: Two times a day (BID) | ORAL | Status: DC

## 2015-04-02 MED ORDER — RAMIPRIL 5 MG PO CAPS: 5.0000 mg | ORAL_CAPSULE | Freq: Every day | ORAL | Status: DC

## 2015-04-02 MED ORDER — DILTIAZEM HCL ER COATED BEADS 240 MG PO CP24: 240.0000 mg | ORAL_CAPSULE | Freq: Every day | ORAL | Status: DC

## 2015-04-02 MED ORDER — AZITHROMYCIN 250 MG PO TABS: 250.0000 mg | ORAL_TABLET | Freq: Every day | ORAL | Status: AC

## 2015-04-02 NOTE — Progress Notes (Signed)
Report from [place]. Patient resting quietly in bed with son at bedside. No distress or complaints at this time. Nann, CM working on discharge per [employer]. Will continue to monitor.

## 2015-04-02 NOTE — Progress Notes (Signed)
[facility] Encounter Note  Patient: Holly Burns / Admit Date: 03/29/2015 / Date of Encounter: 04/02/2015, 9:44 AM   Subjective: Mild shortness of breath and no evidence of chest discomfort but improved heart rate control with addition of new meds  Review of Systems: Positive for: Shortness of breath Negative for: Vision change, hearing change, syncope, dizziness, nausea, vomiting,diarrhea, bloody stool, stomach pain, cough, congestion, diaphoresis, urinary frequency, urinary pain,skin lesions, skin rashes Others previously listed  Objective: Telemetry: Atrial fibrillation with slightly more controlled rate Physical Exam: Blood pressure 156/70, pulse 104, temperature 98.2 F (36.8 C), temperature source Oral, resp. rate 16, height 5\' 2"  (1.575 m), weight 116 lb (52.617 kg), SpO2 91 %. Body mass index is 21.21 kg/(m^2). General: Well developed, well nourished, in no acute distress. Head: Normocephalic, atraumatic, sclera non-icteric, no xanthomas, nares are without discharge. Neck: No apparent masses Lungs: Normal respirations with no wheezes, few rhonchi, no rales , some crackles   Heart: Irregular rate and rhythm, normal S1 S2, no murmur, no rub, no gallop, PMI is normal size and placement, carotid upstroke normal without bruit, jugular venous pressure normal Abdomen: Soft, non-tender, non-distended with normoactive bowel sounds. No hepatosplenomegaly. Abdominal aorta is normal size   Extremities: Trace to 1+ edema, no clubbing, no cyanosis, no ulcers,  Peripheral: 2+ radial, 2+ femoral, 2+ dorsal pedal pulses Neuro: Alert and oriented. Moves all extremities spontaneously. Psych:  Responds to questions appropriately with a normal affect.   Intake/Output Summary (Last 24 hours) at 04/02/15 0944 Last data filed at 04/02/15 0800  Gross per 24 hour  Intake    240 ml  Output    900 ml  Net   -660 ml    Inpatient Medications:  . apixaban  2.5 mg Oral  BID  . azithromycin  250 mg Oral Daily  . budesonide (PULMICORT) nebulizer solution  0.25 mg Nebulization BID  . cholecalciferol  4,000 Units Oral Daily  . diltiazem  240 mg Oral Daily  . docusate sodium  100 mg Oral BID  . donepezil  10 mg Oral QHS  . escitalopram  10 mg Oral Daily  . ferrous sulfate  325 mg Oral BID  . furosemide  20 mg Oral BID  . levothyroxine  75 mcg Oral QAC breakfast  . memantine  10 mg Oral BID  . metoprolol tartrate  50 mg Oral BID  . potassium chloride  20 mEq Oral BID  . [START ON 04/03/2015] predniSONE  10 mg Oral Q breakfast   Followed by  . [START ON 04/04/2015] predniSONE  5 mg Oral Q breakfast  . ramipril  5 mg Oral Daily  . simvastatin  20 mg Oral QHS  . sodium chloride  3 mL Intravenous Q12H  . vitamin C  1,000 mg Oral Daily   Infusions:    Labs:  Recent Labs  04/01/15 0642 04/02/15 0625  NA 143 145  K 2.9* 3.7  CL 110 108  CO2 24 29  GLUCOSE 108* 108*  BUN 39* 38*  CREATININE 1.80* 1.60*  CALCIUM 7.9* 8.4*  MG 1.9  --    No results for input(s): AST, ALT, ALKPHOS, BILITOT, PROT, ALBUMIN in the last 72 hours.  Recent Labs  04/01/15 0642  WBC 18.8*  HGB 12.3  HCT 37.6  MCV 94.8  PLT 384    Recent Labs  03/30/15 1009  TROPONINI 0.32*   Invalid input(s): POCBNP No results for input(s): HGBA1C in the last 72 hours.  Weights: Filed Weights   03/31/15 0510 04/01/15 0658 04/02/15 0559  Weight: 118 lb 1.6 oz (53.57 kg) 123 lb 8 oz (56.019 kg) 116 lb (52.617 kg)     Radiology/Studies:  Dg Chest Portable 1 View  03/29/2015  CLINICAL DATA:  Cough. Patient was found this evening on the floor in front of her bed. Possible fall. History of CHF and hypertension. EXAM: PORTABLE CHEST 1 VIEW COMPARISON:  None. FINDINGS: Normal heart size and pulmonary vascularity. Mild interstitial changes in the lungs with peribronchial thickening suggesting chronic bronchitis. No focal airspace disease or consolidation in the lungs. No  blunting of costophrenic angles. No pneumothorax. Calcified and tortuous aorta. Postoperative changes with left mastectomy and surgical clips in the left axilla. IMPRESSION: Probable chronic bronchitic changes in the lungs. No evidence of active pulmonary disease. Electronically Signed   By: Lucienne Capers M.D.   On: 03/29/2015 22:13     Assessment and Recommendation  81 y.o. female with essential hypertension chronic kidney disease stage IV mixed hyperlipidemia with bronchitis and/or upper respiratory tract infection likely exacerbating and causing new-onset nonvalvular atrial fibrillation with rapid ventricular rate and elevated troponin consistent with demand ischemia without evidence of acute coronary syndrome now with slight mprovements and no evidence of chf 1. continue diltiazem long-acting 240 mg each day to help with heart rate control of atrial fibrillation with a goal heart rate below 100 bpm at rest and possible increase in b-blocker if needed  2. Continue metoprolol for LV systolic dysfunction and heart rate control and change if needed with ambulation 3.   anticoagulation due to significant high risk of stroke with continued atrial fibrillation using apixaban 2.5 mg twice per day 4. continue ambulation and follow for need in adjustments of medication with outpatient adjustments as necessary in one week after possible rehab  Signed, Serafina Royals M.D. FACC

## 2015-04-02 NOTE — Care Management (Signed)
Also found that the following agencies do not have patient as an active 02 account: [employer], [employer], [employer]

## 2015-04-02 NOTE — Discharge Summary (Signed)
[name]    MR#:  [id number]  DATE OF BIRTH:  1924/10/19  DATE OF ADMISSION:  03/29/2015 ADMITTING PHYSICIAN: Harrie Foreman, MD  DATE OF DISCHARGE: 04/02/2015  PRIMARY CARE PHYSICIAN: Margarita Rana, MD    ADMISSION DIAGNOSIS:  Bronchitis [J40] Elevated troponin [R79.89] Acute on chronic renal failure (HCC) [N17.9, N18.9] Atrial fibrillation, unspecified type ([place]) [I48.91]  DISCHARGE DIAGNOSIS:  Active Problems:   Atrial fibrillation with RVR ([place])   SECONDARY DIAGNOSIS:   Past Medical History  Diagnosis Date  . CHF (congestive heart failure) ([place])   . Depression   . GERD (gastroesophageal reflux disease)   . Hyperlipidemia   . Hypertension   . Thyroid disease     HOSPITAL COURSE:   81 year old female with past medical history of dementia, depression, history of CHF, hypothyroidism, hyperlipidemia, GERD who presented to the hospital due to shortness of breath as also noted to be in atrial fibrillation.  #1 acute bronchitis-patient was treated aggressively with IV steroids, DuoNeb's, budesonide nebs, antibiotics and has significantly improved. -Patient presently is being discharged on oral prednisone taper along with Zithromax. -Her bronchospasm and wheezing is significantly improved. She was assessed for home oxygen and she is being arranged for that prior to discharge.   #2 afibrillation with rapid ventricular response-initially when patient was admitted to the hospital she was started on a Cardizem drip and eventually weaned off of it. -A cardiology consult was obtained and patient was started on oral Cardizem along with metoprolol and her rates have improved. She is being discharged on long-term anticoagulation with Eliquis.  - her Echo results noted to be consistent with mild LV dysfunction with EF of 35-40% which maybe chronic. - He will follow-up with cardiology as an  outpatient. Dr. Saralyn Pilar.   #3 dementia- Pt. Will continue Aricept, Namenda.  #4 hypothyroidism- Pt. Will continue Synthroid.  #5 depression- Pt. continue Lexapro.  #6 history of CHF-chronic systolic. clinically patient was not in congestive heart failure while in the hospital. She will Continue Lasix, beta blocker, Ramipril.  #7 hypokalemia- improved w/ supplementation.    Patient is being discharged with home health physical therapy, nursing, aid, social work.  DISCHARGE CONDITIONS:   Stable  CONSULTS OBTAINED:  Treatment Team:  Corey Skains, MD Isaias Cowman, MD  DRUG ALLERGIES:   Allergies  Allergen Reactions  . Codeine Other (See Comments)    Reaction: unknown  . Penicillins Other (See Comments)    Unknown reaction and unable to answer follow-up questions  . Phenytoin Sodium Extended Other (See Comments)    Hyperthermia    DISCHARGE MEDICATIONS:   Current Discharge Medication List    START taking these medications   Details  apixaban (ELIQUIS) 2.5 MG TABS tablet Take 1 tablet (2.5 mg total) by mouth 2 (two) times daily. Qty: 60 tablet, Refills: 1    azithromycin (ZITHROMAX) 250 MG tablet Take 1 tablet (250 mg total) by mouth daily. Qty: 2 each, Refills: 0    diltiazem (CARDIZEM CD) 240 MG 24 hr capsule Take 1 capsule (240 mg total) by mouth daily. Qty: 60 capsule, Refills: 1    predniSONE (DELTASONE) 10 MG tablet Label  & dispense according to the schedule below.  3 Pills PO for 1 day, 2 Pills PO for 1 day, 1 Pill PO for 1 days then STOP. Qty: 6 tablet, Refills: 0      CONTINUE these medications which have CHANGED  Details  !! ramipril (ALTACE) 5 MG capsule Take 1 capsule (5 mg total) by mouth daily. Qty: 60 capsule, Refills: 1     !! - Potential duplicate medications found. Please discuss with provider.    CONTINUE these medications which have NOT CHANGED   Details  acetaminophen (TYLENOL) 500 MG tablet Take 500-1,000 mg by mouth every  6 (six) hours as needed.     amLODipine (NORVASC) 5 MG tablet Take 2.5 mg by mouth daily.     Cholecalciferol (VITAMIN D HIGH POTENCY) 1000 UNITS capsule Take 4,000 Units by mouth daily.     Cranberry-Vitamin C-Inulin (UTI-STAT) LIQD Take 1 capsule by mouth 2 (two) times daily.     donepezil (ARICEPT) 10 MG tablet Take 10 mg by mouth at bedtime.    escitalopram (LEXAPRO) 10 MG tablet Take 10 mg by mouth daily.     Ferrous Sulfate (IRON) 325 (65 FE) MG TABS Take 1 tablet by mouth 2 (two) times daily.     furosemide (LASIX) 20 MG tablet Take 1 tablet (20 mg total) by mouth 2 (two) times daily. Qty: 180 tablet, Refills: 3   Associated Diagnoses: Congestive heart failure, unspecified congestive heart failure chronicity, unspecified congestive heart failure type (HCC)    levothyroxine (SYNTHROID) 75 MCG tablet Take 1 tablet (75 mcg total) by mouth daily before breakfast. Qty: 90 tablet, Refills: 3   Associated Diagnoses: Hypothyroidism, unspecified hypothyroidism type    LORazepam (ATIVAN) 1 MG tablet Take 1 tablet (1 mg total) by mouth every 4 (four) hours as needed for anxiety. Qty: 180 tablet, Refills: 1    memantine (NAMENDA) 10 MG tablet Take 10 mg by mouth 2 (two) times daily.     metoprolol succinate (TOPROL-XL) 25 MG 24 hr tablet Take 1 tablet (25 mg total) by mouth daily. Qty: 90 tablet, Refills: 3    omeprazole (PRILOSEC) 20 MG capsule Take 20 mg by mouth daily.     potassium chloride (MICRO-K) 10 MEQ CR capsule Take 1 capsule (10 mEq total) by mouth daily. Qty: 90 capsule, Refills: 3   Associated Diagnoses: Decreased potassium in the blood    !! ramipril (ALTACE) 10 MG capsule Take 1 capsule (10 mg total) by mouth daily. Qty: 90 capsule, Refills: 3    simvastatin (ZOCOR) 20 MG tablet Take 1 tablet (20 mg total) by mouth at bedtime. Qty: 90 tablet, Refills: 3   Associated Diagnoses: Hypercholesteremia    vitamin C (ASCORBIC ACID) 500 MG tablet Take 1,000 mg by mouth  daily.      !! - Potential duplicate medications found. Please discuss with provider.       DISCHARGE INSTRUCTIONS:   DIET:  Cardiac diet  DISCHARGE CONDITION:  Stable  ACTIVITY:  Activity as tolerated  OXYGEN:  Home Oxygen: Yes.     Oxygen Delivery: 2 liters/min via Patient connected to nasal cannula oxygen  DISCHARGE LOCATION:  home   If you experience worsening of your admission symptoms, develop shortness of breath, life threatening emergency, suicidal or homicidal thoughts you must seek medical attention immediately by calling 911 or calling your MD immediately  if symptoms less severe.  You Must read complete instructions/literature along with all the possible adverse reactions/side effects for all the Medicines you take and that have been prescribed to you. Take any new Medicines after you have completely understood and accpet all the possible adverse reactions/side effects.   Please note  You were cared for by a hospitalist during your hospital stay. If you  have any questions about your discharge medications or the care you received while you were in the hospital after you are discharged, you can call the unit and asked to speak with the hospitalist on call if the hospitalist that took care of you is not available. Once you are discharged, your primary care physician will handle any further medical issues. Please note that NO REFILLS for any discharge medications will be authorized once you are discharged, as it is imperative that you return to your primary care physician (or establish a relationship with a primary care physician if you do not have one) for your aftercare needs so that they can reassess your need for medications and monitor your lab values.     Today   Shortness of breath, wheezing improved. Heart rates have also improved. No shortness of breath, nausea, chest pain.   VITAL SIGNS:  Blood pressure 146/79, pulse 87, temperature 97.9 F (36.6 C),  temperature source Oral, resp. rate 19, height 5\' 2"  (1.575 m), weight 52.617 kg (116 lb), SpO2 97 %.  I/O:   Intake/Output Summary (Last 24 hours) at 04/02/15 1554 Last data filed at 04/02/15 1418  Gross per 24 hour  Intake    360 ml  Output   1300 ml  Net   -940 ml    PHYSICAL EXAMINATION:   GENERAL: 81 y.o.-year-old patient lying in the bed with no acute distress.  EYES: Pupils equal, round, reactive to light and accommodation. No scleral icterus. Extraocular muscles intact.  HEENT: Head atraumatic, normocephalic. Oropharynx and nasopharynx clear.  NECK: Supple, no jugular venous distention. No thyroid enlargement, no tenderness.  LUNGS: Normal breath sounds bilaterally, no wheezing, rales, rhonchi. No use of accessory muscles of respiration.  CARDIOVASCULAR: S1, S2 Irregular. No murmurs, rubs, or gallops.  ABDOMEN: Soft, nontender, nondistended. Bowel sounds present. No organomegaly or mass.  EXTREMITIES: No cyanosis, clubbing or edema b/l.  NEUROLOGIC: Cranial nerves II through XII are intact. No focal Motor or sensory deficits b/l.  PSYCHIATRIC: The patient is alert and oriented x 3.  SKIN: No obvious rash, lesion, or ulcer.   DATA REVIEW:   CBC  Recent Labs Lab 04/01/15 0642  WBC 18.8*  HGB 12.3  HCT 37.6  PLT 384    Chemistries   Recent Labs Lab 03/29/15 2129  04/01/15 0642 04/02/15 0625  NA 143  < > 143 145  K 3.6  < > 2.9* 3.7  CL 107  < > 110 108  CO2 23  < > 24 29  GLUCOSE 191*  < > 108* 108*  BUN 33*  < > 39* 38*  CREATININE 2.01*  < > 1.80* 1.60*  CALCIUM 9.0  < > 7.9* 8.4*  MG  --   --  1.9  --   AST 34  --   --   --   ALT 45  --   --   --   ALKPHOS 76  --   --   --   BILITOT 1.1  --   --   --   < > = values in this interval not displayed.  Cardiac Enzymes  Recent Labs Lab 03/30/15 1009  TROPONINI 0.32*    Microbiology Results  No results found for this or any previous visit.  RADIOLOGY:  No results  found.    Management plans discussed with the patient, family and they are in agreement.  CODE STATUS:     Code Status Orders  Start     Ordered   03/30/15 0206  Full code   Continuous     03/30/15 0205      TOTAL TIME TAKING CARE OF THIS PATIENT: 45 minutes.    Henreitta Leber M.D on 04/02/2015 at 3:54 PM  Between 7am to 6pm - Pager - [phone]  After 6pm go to [email or web address] - password [email or web address] [facility]  [facility]  Office  [phone]  CC: Primary care physician; Margarita Rana, MD

## 2015-04-02 NOTE — Care Management Important Message (Signed)
Important Message  Patient Details  Name: [name] MRN: [id number] Date of Birth: 1924/12/19   Medicare Important Message Given:  Yes    Juliann Pulse A Kalila Adkison 04/02/2015, 10:20 AM

## 2015-04-02 NOTE — Progress Notes (Signed)
SATURATION QUALIFICATIONS: (This note is used to comply with regulatory documentation for home oxygen)  Patient Saturations on Room Air at Rest = 92 %  Patient Saturations on Room Air while Ambulating = 88%  Patient Saturations on 2 Liters of oxygen while Ambulating = 91%  Please briefly explain why patient needs home oxygen: 

## 2015-04-02 NOTE — Care Management (Signed)
Patient is for discharge home today.  spoke with patient's son Holly Burns.  Has not assisted in finding out the name of patient company that is providing the home 02.  It is believed that this is nocturnal only.  Explained the difference between noctural /continuous.  Holly Burns verbalizes concern of his mother continuing to live on her own.  She does not always remember to take her medications and she gets up at night.   Discussed that patient does not meet criteria for skilled nursing placement but could proceed with this and or assisted living if willing to pay privately. Discussed that family members may need to take turns staying with patient at night until other arrangements can be made.   Discussed that the concerns he is voicing about his mother continuing to live on her own is not a new concern with this hospitalization and Holly Burns confirms.  He and his sister received a list of agencies to provide in home assistance but it does not appear that he has made any contact. Patient to discharge on Eliquis and provided coupon for 30 day

## 2015-04-02 NOTE — Progress Notes (Signed)
Patient given discharge teaching and paperwork regarding medications, diet, follow-up appointments and activity. Patient understanding verbalized. No complaints at this time. IV and telemetry discontinued prior to leaving. Skin assessment as previously charted and vitals are stable; portable tank delivered. Patient being discharged to home. Son present during discharge teaching. No further needs by Care Management. Prescriptions printed and given to son.

## 2015-04-02 NOTE — Progress Notes (Signed)
Spoke with Nann, CM regarding discharge. When portable O2 tank is delivered, patient will be ready to go. Will continue to monitor.

## 2015-04-02 NOTE — Care Management (Signed)
[facility] has relayed that agency can provide 02.  The oxygen order can be transferred.    Son is in agreement.

## 2015-04-02 NOTE — Progress Notes (Addendum)
Spoke with patients son regarding discharge. Son is uncomfortable sending patient home alone even with [facility] d/t memory issues and fall risk. RN made Dr. Verdell Carmine aware and will speak with CM during progression rounds.

## 2015-04-02 NOTE — Care Management CHF Note (Addendum)
Found that patient is receiving her oxygen through [employer] out of the [place] office .  Patient is not able to change to another company until 2018.  Phone number of agency- [phone] and fax number [phone].   This agency will speak with [facility] to see if the two agencies can work together to provide a portable to for transport home which they have done.  Additional portable tanks to  be delivered to patient's home by [employer].

## 2015-04-02 NOTE — Care Management (Signed)
Informed that [facility] providing nocturnal 02 but when called agency, informed agency is not supplying the 02.  Called [employer], [employer], [employer], [facility], [employer], [facility] and none have this patient as active. Son [name] says that [facility] informed him [facility] provided 02.  Contacted  [facility] a second time and again informed patient is not active.  Agency preference for home health is [facility].  This agency would also be able to provide inhome services if son wishes to arrange for inhome services.  SN services- med administration, cp assess, pulse oximetry;  PT  for ambulation and home safety; aide for personal care and social work for long range planning and community resources.,  Will also provide son with contact information for dementia support through [facility].  Have faxed the order for continuous 02 for [facility] being that son is insisting that is the agency that has provided her nocturnal 02.   Alvis Lemmings will initiate services 12/20.  Informed patient and her son.

## 2015-04-12 ENCOUNTER — Ambulatory Visit (INDEPENDENT_AMBULATORY_CARE_PROVIDER_SITE_OTHER): Payer: Medicare Other | Admitting: Family Medicine

## 2015-04-12 ENCOUNTER — Encounter: Payer: Self-pay | Admitting: Family Medicine

## 2015-04-12 VITALS — BP 116/72 | HR 80 | Temp 97.6°F | Resp 20 | Wt 124.0 lb

## 2015-04-12 DIAGNOSIS — R0902 Hypoxemia: Secondary | ICD-10-CM

## 2015-04-12 DIAGNOSIS — I509 Heart failure, unspecified: Secondary | ICD-10-CM | POA: Diagnosis not present

## 2015-04-12 DIAGNOSIS — I4891 Unspecified atrial fibrillation: Secondary | ICD-10-CM | POA: Diagnosis not present

## 2015-04-12 DIAGNOSIS — Z9981 Dependence on supplemental oxygen: Secondary | ICD-10-CM

## 2015-04-12 NOTE — Progress Notes (Signed)
Subjective:    Patient ID: Holly Burns, female    DOB: [birth year], 81 y.o.   MRN: [id number]  HPI   Follow up Hospitalization  Patient was admitted to [facility] on 03/29/2015 and discharged on 04/02/2015. She was treated for Bronchitis, elevated troponin, acute chronic renal failure, A Fib with RVR. Treatment for this included Eliquis, Z Pak, Diltiazem, Prednisone. She reports excellent compliance with treatment. She reports this condition is Improved.  Pt saw Dr. Saralyn Pilar yesterday to FU on A Fib. Pt is also using 2 L of O2 all day, rather than at night only.  ------------------------------------------------------------------------------------   Review of Systems  Constitutional: Positive for fatigue. Negative for fever, chills, diaphoresis, activity change, appetite change and unexpected weight change.  Respiratory: Positive for cough (improving). Negative for shortness of breath and wheezing.   Cardiovascular: Negative for chest pain, palpitations and leg swelling.   BP 116/72 mmHg  Pulse 80  Temp(Src) 97.6 F (36.4 C) (Oral)  Resp 20  Wt 124 lb (56.246 kg)  SpO2 95%   Patient Active Problem List   Diagnosis Date Noted  . Atrial fibrillation with RVR ([place]) 03/30/2015  . Adult idiopathic generalized osteoporosis 03/12/2015  . Anxiety 10/13/2014  . B12 deficiency 10/13/2014  . Breast CA ([place]) 10/13/2014  . Atherosclerosis of coronary artery 10/13/2014  . Chronic kidney disease (CKD), stage III (moderate) 10/13/2014  . CCF (congestive cardiac failure) ([place]) 10/13/2014  . Clinical depression 10/13/2014  . DD (diverticular disease) 10/13/2014  . Bleeding gastrointestinal 10/13/2014  . Acid reflux 10/13/2014  . 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 Hypercholesteremia 10/13/2014  . BP (high blood pressure) 10/13/2014  . Decreased potassium in the blood 10/13/2014  . Adult hypothyroidism 10/13/2014  . Hypoxia 10/13/2014  . OP (osteoporosis) 10/13/2014  . Dementia  10/13/2014  . Avitaminosis D 10/13/2014  . Involutional osteoporosis 01/02/2014  . Heart attack ([place]) 09/12/2013  . H/O cardiac catheterization 09/12/2013  . Brain tumor ([place]) 09/12/2013   Past Medical History  Diagnosis Date  . CHF (congestive heart failure) ([place])   . Depression   . GERD (gastroesophageal reflux disease)   . Hyperlipidemia   . Hypertension   . Thyroid disease    Current Outpatient Prescriptions on File Prior to Visit  Medication Sig  . acetaminophen (TYLENOL) 500 MG tablet Take 500-1,000 mg by mouth every 6 (six) hours as needed.   Marland Kitchen amLODipine (NORVASC) 5 MG tablet Take 2.5 mg by mouth daily.   Marland Kitchen apixaban (ELIQUIS) 2.5 MG TABS tablet Take 1 tablet (2.5 mg total) by mouth 2 (two) times daily.  . Cholecalciferol (VITAMIN D HIGH POTENCY) 1000 UNITS capsule Take 4,000 Units by mouth daily.   . Cranberry-Vitamin C-Inulin (UTI-STAT) LIQD Take 1 capsule by mouth 2 (two) times daily.   Marland Kitchen diltiazem (CARDIZEM CD) 240 MG 24 hr capsule Take 1 capsule (240 mg total) by mouth daily.  Marland Kitchen donepezil (ARICEPT) 10 MG tablet Take 10 mg by mouth at bedtime.  Marland Kitchen escitalopram (LEXAPRO) 10 MG tablet Take 10 mg by mouth daily.   . Ferrous Sulfate (IRON) 325 (65 FE) MG TABS Take 1 tablet by mouth 2 (two) times daily.   . furosemide (LASIX) 20 MG tablet Take 1 tablet (20 mg total) by mouth 2 (two) times daily.  Marland Kitchen levothyroxine (SYNTHROID) 75 MCG tablet Take 1 tablet (75 mcg total) by mouth daily before breakfast.  . LORazepam (ATIVAN) 1 MG tablet Take 1 tablet (1 mg total) by mouth every 4 (four) hours as  needed for anxiety. (Patient taking differently: Take 0.5-1 mg by mouth every 4 (four) hours as needed for anxiety. )  . memantine (NAMENDA) 10 MG tablet Take 10 mg by mouth 2 (two) times daily.   . metoprolol succinate (TOPROL-XL) 25 MG 24 hr tablet Take 1 tablet (25 mg total) by mouth daily.  Marland Kitchen omeprazole (PRILOSEC) 20 MG capsule Take 20 mg by mouth daily.   . potassium chloride (MICRO-K)  10 MEQ CR capsule Take 1 capsule (10 mEq total) by mouth daily.  . ramipril (ALTACE) 5 MG capsule Take 1 capsule (5 mg total) by mouth daily.  . simvastatin (ZOCOR) 20 MG tablet Take 1 tablet (20 mg total) by mouth at bedtime.  . vitamin C (ASCORBIC ACID) 500 MG tablet Take 1,000 mg by mouth daily.    No current facility-administered medications on file prior to visit.   Allergies  Allergen Reactions  . Codeine Other (See Comments)    Reaction: unknown  . Penicillins Other (See Comments)    Unknown reaction and unable to answer follow-up questions  . Phenytoin Sodium Extended Other (See Comments)    Hyperthermia   Past Surgical History  Procedure Laterality Date  . Angioplasty      due to MI LV ef-40%  . 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 Craniotomy    . Abdominal hysterectomy  1980  . Cataract surgery Left    Social History   Social History  . Marital Status: Widowed    Spouse Name: N/A  . Number of Children: 2  . Years of Education: H/S   Occupational History  . Retired    Social History Main Topics  . Smoking status: Never Smoker   . Smokeless tobacco: Never Used  . Alcohol Use: No  . Drug Use: No  . Sexual Activity: Not on file   Other Topics Concern  . Not on file   Social History Narrative   Family History  Problem Relation Age of Onset  . Hypertension Mother   . Cerebrovascular Accident Mother   . Heart disease Father       Objective:   Physical Exam  Constitutional: She is oriented to person, place, and time. She appears well-developed and well-nourished.  HENT:  Right Ear: Tympanic membrane and ear canal normal.  Left Ear: Tympanic membrane and ear canal normal.  Eyes: Lids are normal. Lids are everted and swept, no foreign bodies found.  Neck: Carotid bruit is not present.  Cardiovascular: Normal rate and regular rhythm.   Pulmonary/Chest: Effort normal and breath sounds normal.   Abdominal: Soft. Normal appearance, normal aorta and bowel sounds are normal. There is no tenderness.  Musculoskeletal: Normal range of motion.  Neurological: She is alert and oriented to person, place, and time.  Skin: Skin is warm, dry and intact.  Psychiatric: She has a normal mood and affect. Her speech is normal and behavior is normal. Judgment and thought content normal. Cognition and memory are normal.   BP 116/72 mmHg  Pulse 80  Temp(Src) 97.6 F (36.4 C) (Oral)  Resp 20  Wt 124 lb (56.246 kg)  SpO2 95%      Assessment & Plan:  1. Congestive heart failure, unspecified congestive heart failure chronicity, unspecified congestive heart failure type ([place]) Stable on current medication. Continue PT.    Filled out FMLA paper work for daughter today.  Will continue to need more support from  family. Will also look into meals on wheels.    2. Atrial fibrillation with RVR ([facility]) BP low on Cardizem .Will continue to hold Cardizem. Will obtain BP machine and bring in tomorrow so we can check her BP and calibrate machine.  Then, will monitor daily . Only start Cardizem if BP elevated over around 150/90 or increased resting pulse over 100 or patient with symptoms. Son to stay in communication with me.   - diltiazem (CARDIZEM CD) 240 MG 24 hr capsule; Take 1 capsule (240 mg total) by mouth daily as needed.  Dispense: 1 capsule; Refill: 1  3. Hypoxemia requiring supplemental oxygen Is dragging rolling oxygen tank at this time. Will order a smaller portable unit. PT to help assess safest way to transport oxygen. Does have some gait instability.    Patient was seen and examined by Jerrell Belfast, MD, and note scribed by Renaldo Fiddler, CMA. I have reviewed the document for accuracy and completeness and I agree with above. Jerrell Belfast, MD   Margarita Rana, MD

## 2015-04-13 MED ORDER — DILTIAZEM HCL ER COATED BEADS 240 MG PO CP24: 240.0000 mg | ORAL_CAPSULE | Freq: Every day | ORAL | Status: AC | PRN

## 2015-05-23 ENCOUNTER — Other Ambulatory Visit: Payer: Self-pay | Admitting: Family Medicine

## 2015-05-23 DIAGNOSIS — I4891 Unspecified atrial fibrillation: Secondary | ICD-10-CM

## 2015-09-17 ENCOUNTER — Ambulatory Visit: Payer: Medicare Other | Admitting: Family Medicine

## 2016-07-14 ENCOUNTER — Encounter: Payer: Self-pay | Admitting: Emergency Medicine

## 2016-07-14 ENCOUNTER — Emergency Department: Payer: Medicare (Managed Care)

## 2016-07-14 ENCOUNTER — Emergency Department
Admission: EM | Admit: 2016-07-14 | Discharge: 2016-07-14 | Disposition: A | Discharge status: Home / Self Care | Payer: Medicare (Managed Care) | Attending: Emergency Medicine | Admitting: Emergency Medicine

## 2016-07-14 DIAGNOSIS — I2510 Atherosclerotic heart disease of native coronary artery without angina pectoris: Secondary | ICD-10-CM | POA: Diagnosis not present

## 2016-07-14 DIAGNOSIS — Y999 Unspecified external cause status: Secondary | ICD-10-CM | POA: Insufficient documentation

## 2016-07-14 DIAGNOSIS — W1800XA Striking against unspecified object with subsequent fall, initial encounter: Secondary | ICD-10-CM | POA: Diagnosis not present

## 2016-07-14 DIAGNOSIS — Z85841 Personal history of malignant neoplasm of brain: Secondary | ICD-10-CM | POA: Insufficient documentation

## 2016-07-14 DIAGNOSIS — Y92009 Unspecified place in unspecified non-institutional (private) residence as the place of occurrence of the external cause: Secondary | ICD-10-CM | POA: Diagnosis not present

## 2016-07-14 DIAGNOSIS — Z853 Personal history of malignant neoplasm of breast: Secondary | ICD-10-CM | POA: Diagnosis not present

## 2016-07-14 DIAGNOSIS — Z79899 Other long term (current) drug therapy: Secondary | ICD-10-CM | POA: Insufficient documentation

## 2016-07-14 DIAGNOSIS — I509 Heart failure, unspecified: Secondary | ICD-10-CM | POA: Insufficient documentation

## 2016-07-14 DIAGNOSIS — N183 Chronic kidney disease, stage 3 (moderate): Secondary | ICD-10-CM | POA: Diagnosis not present

## 2016-07-14 DIAGNOSIS — S0990XA Unspecified injury of head, initial encounter: Secondary | ICD-10-CM | POA: Diagnosis present

## 2016-07-14 DIAGNOSIS — I130 Hypertensive heart and chronic kidney disease with heart failure and stage 1 through stage 4 chronic kidney disease, or unspecified chronic kidney disease: Secondary | ICD-10-CM | POA: Insufficient documentation

## 2016-07-14 DIAGNOSIS — S0101XA Laceration without foreign body of scalp, initial encounter: Secondary | ICD-10-CM | POA: Diagnosis not present

## 2016-07-14 DIAGNOSIS — E039 Hypothyroidism, unspecified: Secondary | ICD-10-CM | POA: Diagnosis not present

## 2016-07-14 DIAGNOSIS — W19XXXA Unspecified fall, initial encounter: Secondary | ICD-10-CM

## 2016-07-14 DIAGNOSIS — N390 Urinary tract infection, site not specified: Secondary | ICD-10-CM | POA: Insufficient documentation

## 2016-07-14 DIAGNOSIS — Y939 Activity, unspecified: Secondary | ICD-10-CM | POA: Insufficient documentation

## 2016-07-14 LAB — COMPREHENSIVE METABOLIC PANEL
ALBUMIN: 3.7 g/dL (ref 3.5–5.0)
ALT: 14 U/L (ref 14–54)
AST: 19 U/L (ref 15–41)
Alkaline Phosphatase: 34 U/L — ABNORMAL LOW (ref 38–126)
Anion gap: 13 (ref 5–15)
BILIRUBIN TOTAL: 1.2 mg/dL (ref 0.3–1.2)
BUN: 22 mg/dL — AB (ref 6–20)
CHLORIDE: 107 mmol/L (ref 101–111)
CO2: 23 mmol/L (ref 22–32)
CREATININE: 1.46 mg/dL — AB (ref 0.44–1.00)
Calcium: 8.6 mg/dL — ABNORMAL LOW (ref 8.9–10.3)
GFR calc Af Amer: 36 mL/min — ABNORMAL LOW (ref >60)
GFR, EST NON AFRICAN AMERICAN: 31 mL/min — AB (ref >60)
GLUCOSE: 156 mg/dL — AB (ref 65–99)
Potassium: 3.4 mmol/L — ABNORMAL LOW (ref 3.5–5.1)
Sodium: 143 mmol/L (ref 135–145)
Total Protein: 6.2 g/dL — ABNORMAL LOW (ref 6.5–8.1)

## 2016-07-14 LAB — URINALYSIS, COMPLETE (UACMP) WITH MICROSCOPIC
BILIRUBIN URINE: NEGATIVE
Glucose, UA: NEGATIVE mg/dL
HGB URINE DIPSTICK: NEGATIVE
Ketones, ur: 5 mg/dL — AB
Nitrite: NEGATIVE
Protein, ur: NEGATIVE mg/dL
SPECIFIC GRAVITY, URINE: 1.013 (ref 1.005–1.030)
SQUAMOUS EPITHELIAL / LPF: NONE SEEN
pH: 5 (ref 5.0–8.0)

## 2016-07-14 LAB — CBC WITH DIFFERENTIAL/PLATELET
BASOS ABS: 0 10*3/uL (ref 0–0.1)
Basophils Relative: 0 %
Eosinophils Absolute: 0.1 10*3/uL (ref 0–0.7)
Eosinophils Relative: 1 %
HEMATOCRIT: 45.9 % (ref 35.0–47.0)
Hemoglobin: 15.2 g/dL (ref 12.0–16.0)
LYMPHS PCT: 9 %
Lymphs Abs: 1.5 10*3/uL (ref 1.0–3.6)
MCH: 29.7 pg (ref 26.0–34.0)
MCHC: 33.2 g/dL (ref 32.0–36.0)
MCV: 89.5 fL (ref 80.0–100.0)
Monocytes Absolute: 0.8 10*3/uL (ref 0.2–0.9)
Monocytes Relative: 5 %
NEUTROS ABS: 14.6 10*3/uL — AB (ref 1.4–6.5)
Neutrophils Relative %: 85 %
PLATELETS: 268 10*3/uL (ref 150–440)
RBC: 5.14 MIL/uL (ref 3.80–5.20)
RDW: 14.8 % — ABNORMAL HIGH (ref 11.5–14.5)
WBC: 17 10*3/uL — ABNORMAL HIGH (ref 3.6–11.0)

## 2016-07-14 LAB — TROPONIN I

## 2016-07-14 MED ORDER — LIDOCAINE-EPINEPHRINE (PF) 1 %-1:200000 IJ SOLN
30.0000 mL | Freq: Once | INTRAMUSCULAR | Status: DC
  Filled 2016-07-14: qty 30

## 2016-07-14 MED ORDER — LIDOCAINE HCL (PF) 1 % IJ SOLN: 10.0000 mL | Freq: Once | INTRAMUSCULAR | Status: AC

## 2016-07-14 MED ORDER — CEPHALEXIN 250 MG PO CAPS: 250.0000 mg | ORAL_CAPSULE | Freq: Two times a day (BID) | ORAL | 0 refills | Status: AC

## 2016-07-14 MED ORDER — CEPHALEXIN 250 MG PO CAPS
250.0000 mg | ORAL_CAPSULE | Freq: Once | ORAL | Status: AC
  Administered 2016-07-14: 250 mg via ORAL
  Filled 2016-07-14: qty 1

## 2016-07-14 MED ORDER — LIDOCAINE HCL (PF) 1 % IJ SOLN
INTRAMUSCULAR | Status: AC
  Filled 2016-07-14: qty 10

## 2016-07-14 NOTE — ED Notes (Signed)
Approximately 1 in laceration noted to back right of head. Bleeding controlled at this time.

## 2016-07-14 NOTE — ED Notes (Signed)
In and out cath preformed by this RN. Assisted by Kirke Shaggy, RN. Patient tolerated well. Urine sent to lab.

## 2016-07-14 NOTE — ED Provider Notes (Signed)
-----------------------------------------   5:16 PM on 07/14/2016 -----------------------------------------  Patient's labs have resulted showing a moderate leukocytosis however in reviewing the patient's labs she possibly has a chronic leukocytosis. Rest for blood work is largely at her baseline. She does have a urinary tract infection. Given the patient's recent fall with urinary tract infection on urinalysis, I have sent a urine culture and we'll start the patient on Keflex twice a day for the next 7 days. Patient will follow up with her primary care doctor.   Harvest Dark, MD 07/14/16 [phone]

## 2016-07-14 NOTE — ED Provider Notes (Signed)
[facility] Emergency Department Provider Note       Time seen: ----------------------------------------- 3:15 PM on 07/14/2016 -----------------------------------------     I have reviewed the triage vital signs and the nursing notes.   HISTORY   Chief Complaint Fall    HPI Holly Burns is a 83 y.o. female who presents to the ED for after a fall. EMS reported the patient was found on the floor home after hitting her medical alert button. She was hypotensive and diaphoretic on EMS arrival with foul-smelling urine. She does not remember falling and thinks she was on the floor for approximately an hour. She arrives oriented to self and place but not time. She does think she hit her head and has reported posterior scalp laceration.   Past Medical History:  Diagnosis Date  . CHF (congestive heart failure) ([place])   . Depression   . GERD (gastroesophageal reflux disease)   . Hyperlipidemia   . Hypertension   . Thyroid disease     Patient Active Problem List   Diagnosis Date Noted  . Atrial fibrillation with RVR ([place]) 03/30/2015  . Adult idiopathic generalized osteoporosis 03/12/2015  . Anxiety 10/13/2014  . B12 deficiency 10/13/2014  . Breast CA ([place]) 10/13/2014  . Atherosclerosis of coronary artery 10/13/2014  . Chronic kidney disease (CKD), stage III (moderate) 10/13/2014  . CCF (congestive cardiac failure) ([place]) 10/13/2014  . Clinical depression 10/13/2014  . DD (diverticular disease) 10/13/2014  . Bleeding gastrointestinal 10/13/2014  . Acid reflux 10/13/2014  . 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 Hypercholesteremia 10/13/2014  . BP (high blood pressure) 10/13/2014  . Decreased potassium in the blood 10/13/2014  . Adult hypothyroidism 10/13/2014  . Hypoxia 10/13/2014  . OP (osteoporosis) 10/13/2014  . Dementia 10/13/2014  . Avitaminosis D 10/13/2014  . Involutional osteoporosis 01/02/2014  . Heart attack 09/12/2013  . H/O cardiac  catheterization 09/12/2013  . Brain tumor ([place]) 09/12/2013    Past Surgical History:  Procedure Laterality Date  . ABDOMINAL HYSTERECTOMY  1980  . ANGIOPLASTY     due to MI LV ef-40%  . cataract surgery Left   . CRANIOTOMY    . MASTECTOMY Left 02/26/1995   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, no chemo or radiation     Allergies Codeine; Penicillins; and Phenytoin sodium extended  Social History Social History  Substance Use Topics  . Smoking status: Never Smoker  . Smokeless tobacco: Never Used  . Alcohol use No    Review of Systems Constitutional: Negative for fever. Cardiovascular: Negative for chest pain. Respiratory: Negative for shortness of breath. Gastrointestinal: Negative for abdominal pain, vomiting and diarrhea. Skin: Positive for scalp laceration Neurological: Negative for headaches, focal weakness or numbness.  10-point ROS otherwise negative.  ____________________________________________   PHYSICAL EXAM:  VITAL SIGNS: ED Triage Vitals  Enc Vitals Group     BP 07/14/16 1423 (!) 109/55     Pulse Rate 07/14/16 1423 85     Resp 07/14/16 1423 20     Temp 07/14/16 1423 97.4 F (36.3 C)     Temp Source 07/14/16 1423 Oral     SpO2 07/14/16 1423 98 %     Weight 07/14/16 1425 120 lb (54.4 kg)     Height 07/14/16 1425 5' 1.5" (1.562 m)     Head Circumference --      Peak Flow --      Pain Score 07/14/16 1423 0     Pain Loc --  Pain Edu? --      Excl. in [place]? --     Constitutional: Alert But disoriented, Well appearing and in no distress. Eyes: Conjunctivae are normal. PERRL. Normal extraocular movements. ENT   Head: Normocephalic, midline parietal scalp laceration   Nose: No congestion/rhinnorhea.   Mouth/Throat: Mucous membranes are moist.   Neck: No stridor. Cardiovascular: Normal rate, regular rhythm. No murmurs, rubs, or gallops. Respiratory: Normal respiratory effort without tachypnea nor retractions. Breath sounds are  clear and equal bilaterally. No wheezes/rales/rhonchi. Gastrointestinal: Soft and nontender. Normal bowel sounds Musculoskeletal: Nontender with normal range of motion in extremities. No lower extremity tenderness nor edema. Neurologic:  Normal speech and language. No gross focal neurologic deficits are appreciated.  Skin:  Midline 3 cm parietal scalp laceration Psychiatric: Mood and affect are normal. Speech and behavior are normal.  ____________________________________________  EKG: Interpreted by me. Atrial fibrillation with a rate of 83 bpm, normal QRS, normal QT. Left axis deviation.  ____________________________________________  ED COURSE:  Pertinent labs & imaging results that were available during my care of the patient were reviewed by me and considered in my medical decision making (see chart for details). Patient presents for fall and head injury, we will assess with labs and imaging as indicated.   Marland Kitchen.Laceration Repair Date/Time: 07/14/2016 3:18 PM Performed by: Earleen Newport Authorized by: Lenise Arena E   Consent:    Consent obtained:  Verbal   Consent given by:  Patient Anesthesia (see MAR for exact dosages):    Anesthesia method:  Local infiltration   Local anesthetic:  Lidocaine 1% WITH epi Laceration details:    Location:  Scalp   Length (cm):  3   Depth (mm):  5 Repair type:    Repair type:  Simple Pre-procedure details:    Preparation:  Patient was prepped and draped in usual sterile fashion Treatment:    Area cleansed with:  Betadine   Amount of cleaning:  Standard   Irrigation solution:  Sterile saline   Irrigation method:  Syringe Skin repair:    Repair method:  Staples   Number of staples:  3 Approximation:    Approximation:  Close   Vermilion border: well-aligned   Post-procedure details:    Dressing:  Antibiotic ointment   Patient tolerance of procedure:  Tolerated well, no immediate  complications   ____________________________________________   LABS (pertinent positives/negatives)  Labs Reviewed  CBC WITH DIFFERENTIAL/PLATELET - Abnormal; Notable for the following:       Result Value   WBC 17.0 (*)    RDW 14.8 (*)    Neutro Abs 14.6 (*)    All other components within normal limits  COMPREHENSIVE METABOLIC PANEL  TROPONIN I  URINALYSIS, COMPLETE (UACMP) WITH MICROSCOPIC    RADIOLOGY Images were viewed by me CT head IMPRESSION: Diffuse atrophy and moderate small vessel ischemic change with old small lacunar infarcts present bilaterally. No acute intracranial abnormality.   ____________________________________________  FINAL ASSESSMENT AND PLAN  Fall, head injury, scalp laceration  Plan: Patient's labs and imaging were dictated above. Patient had presented for fall and head injury. Final disposition is pending at this time.   Earleen Newport, MD   Note: This note was generated in part or whole with voice recognition software. Voice recognition is usually quite accurate but there are transcription errors that can and very often do occur. I apologize for any typographical errors that were not detected and corrected.     Earleen Newport, MD 07/14/16 [phone]

## 2016-07-14 NOTE — ED Notes (Signed)
Hooked pt up to monitor.

## 2016-07-14 NOTE — ED Triage Notes (Signed)
Patient presents to the  ED via EMS post fall.  Per EMS patient was found on the floor of her home after hitting her med-alert button.  Patient was hypotensive and clammy on ems arrival with foul smelling urine.  Patient does not remember falling but thinks she was on the floor for approx. 1 hour.  Patient is oriented to self and place but not time.  Patient repeating that she would just like to, "go home to heaven to see my lord."  Patient denies any pain at this time but states, "I do think I hit my head because I feel something back there."

## 2017-07-23 ENCOUNTER — Telehealth: Payer: Self-pay | Admitting: Family Medicine

## 2017-07-23 NOTE — Telephone Encounter (Signed)
Left patient a message to return call for an appt. °

## 2018-03-17 ENCOUNTER — Emergency Department
Admission: EM | Admit: 2018-03-17 | Discharge: 2018-03-18 | Disposition: A | Discharge status: Home / Self Care | Payer: Medicare (Managed Care) | Attending: Emergency Medicine | Admitting: Emergency Medicine

## 2018-03-17 ENCOUNTER — Other Ambulatory Visit: Payer: Self-pay

## 2018-03-17 ENCOUNTER — Emergency Department: Payer: Medicare (Managed Care)

## 2018-03-17 DIAGNOSIS — Z79899 Other long term (current) drug therapy: Secondary | ICD-10-CM | POA: Diagnosis not present

## 2018-03-17 DIAGNOSIS — Y9389 Activity, other specified: Secondary | ICD-10-CM | POA: Insufficient documentation

## 2018-03-17 DIAGNOSIS — I110 Hypertensive heart disease with heart failure: Secondary | ICD-10-CM | POA: Insufficient documentation

## 2018-03-17 DIAGNOSIS — F0390 Unspecified dementia without behavioral disturbance: Secondary | ICD-10-CM | POA: Insufficient documentation

## 2018-03-17 DIAGNOSIS — W010XXA Fall on same level from slipping, tripping and stumbling without subsequent striking against object, initial encounter: Secondary | ICD-10-CM | POA: Insufficient documentation

## 2018-03-17 DIAGNOSIS — Y929 Unspecified place or not applicable: Secondary | ICD-10-CM | POA: Diagnosis not present

## 2018-03-17 DIAGNOSIS — Z7901 Long term (current) use of anticoagulants: Secondary | ICD-10-CM | POA: Diagnosis not present

## 2018-03-17 DIAGNOSIS — I130 Hypertensive heart and chronic kidney disease with heart failure and stage 1 through stage 4 chronic kidney disease, or unspecified chronic kidney disease: Secondary | ICD-10-CM | POA: Insufficient documentation

## 2018-03-17 DIAGNOSIS — W19XXXA Unspecified fall, initial encounter: Secondary | ICD-10-CM

## 2018-03-17 DIAGNOSIS — E039 Hypothyroidism, unspecified: Secondary | ICD-10-CM | POA: Diagnosis not present

## 2018-03-17 DIAGNOSIS — I509 Heart failure, unspecified: Secondary | ICD-10-CM | POA: Diagnosis not present

## 2018-03-17 DIAGNOSIS — N183 Chronic kidney disease, stage 3 (moderate): Secondary | ICD-10-CM | POA: Insufficient documentation

## 2018-03-17 DIAGNOSIS — N3001 Acute cystitis with hematuria: Secondary | ICD-10-CM | POA: Insufficient documentation

## 2018-03-17 DIAGNOSIS — Y999 Unspecified external cause status: Secondary | ICD-10-CM | POA: Diagnosis not present

## 2018-03-17 DIAGNOSIS — R55 Syncope and collapse: Secondary | ICD-10-CM | POA: Diagnosis present

## 2018-03-17 LAB — CBC WITH DIFFERENTIAL/PLATELET
ABS IMMATURE GRANULOCYTES: 0.27 10*3/uL — AB (ref 0.00–0.07)
BASOS PCT: 0 %
Basophils Absolute: 0 10*3/uL (ref 0.0–0.1)
Eosinophils Absolute: 0.1 10*3/uL (ref 0.0–0.5)
Eosinophils Relative: 1 %
HCT: 46.2 % — ABNORMAL HIGH (ref 36.0–46.0)
HEMOGLOBIN: 14.6 g/dL (ref 12.0–15.0)
Immature Granulocytes: 2 %
Lymphocytes Relative: 8 %
Lymphs Abs: 1.3 10*3/uL (ref 0.7–4.0)
MCH: 31.1 pg (ref 26.0–34.0)
MCHC: 31.6 g/dL (ref 30.0–36.0)
MCV: 98.5 fL (ref 80.0–100.0)
MONO ABS: 0.7 10*3/uL (ref 0.1–1.0)
MONOS PCT: 5 %
NEUTROS ABS: 13.8 10*3/uL — AB (ref 1.7–7.7)
Neutrophils Relative %: 84 %
PLATELETS: 290 10*3/uL (ref 150–400)
RBC: 4.69 MIL/uL (ref 3.87–5.11)
RDW: 14 % (ref 11.5–15.5)
WBC: 16.3 10*3/uL — ABNORMAL HIGH (ref 4.0–10.5)
nRBC: 0 % (ref 0.0–0.2)

## 2018-03-17 LAB — URINALYSIS, COMPLETE (UACMP) WITH MICROSCOPIC
BILIRUBIN URINE: NEGATIVE
GLUCOSE, UA: NEGATIVE mg/dL
Hgb urine dipstick: NEGATIVE
KETONES UR: NEGATIVE mg/dL
Nitrite: NEGATIVE
PH: 5 (ref 5.0–8.0)
Protein, ur: NEGATIVE mg/dL
SPECIFIC GRAVITY, URINE: 1.02 (ref 1.005–1.030)

## 2018-03-17 LAB — COMPREHENSIVE METABOLIC PANEL
ALT: 12 U/L (ref 0–44)
AST: 17 U/L (ref 15–41)
Albumin: 3.4 g/dL — ABNORMAL LOW (ref 3.5–5.0)
Alkaline Phosphatase: 43 U/L (ref 38–126)
Anion gap: 7 (ref 5–15)
BUN: 19 mg/dL (ref 8–23)
CHLORIDE: 103 mmol/L (ref 98–111)
CO2: 28 mmol/L (ref 22–32)
CREATININE: 1.4 mg/dL — AB (ref 0.44–1.00)
Calcium: 8.1 mg/dL — ABNORMAL LOW (ref 8.9–10.3)
GFR calc Af Amer: 38 mL/min — ABNORMAL LOW (ref >60)
GFR calc non Af Amer: 33 mL/min — ABNORMAL LOW (ref >60)
Glucose, Bld: 144 mg/dL — ABNORMAL HIGH (ref 70–99)
Potassium: 3.6 mmol/L (ref 3.5–5.1)
SODIUM: 138 mmol/L (ref 135–145)
Total Bilirubin: 0.5 mg/dL (ref 0.3–1.2)
Total Protein: 5.7 g/dL — ABNORMAL LOW (ref 6.5–8.1)

## 2018-03-17 LAB — TROPONIN I: Troponin I: <0.03 ng/mL (ref <0.03)

## 2018-03-17 MED ORDER — SODIUM CHLORIDE 0.9 % IV SOLN
1.0000 g | Freq: Once | INTRAVENOUS | Status: AC
  Administered 2018-03-17: 1 g via INTRAVENOUS
  Filled 2018-03-17: qty 10

## 2018-03-17 MED ORDER — CEPHALEXIN 500 MG PO CAPS: 500.0000 mg | ORAL_CAPSULE | Freq: Three times a day (TID) | ORAL | 0 refills | Status: AC

## 2018-03-17 NOTE — ED Provider Notes (Addendum)
[facility] Emergency Department Provider Note  ____________________________________________   I have reviewed the triage vital signs and the nursing notes. Where available I have reviewed prior notes and, if possible and indicated, outside hospital notes.    HISTORY  Chief Complaint Loss of Consciousness    HPI GENIECE AKERS is a 84 y.o. female who has a history of CHF, reflux disease hypertension thyroid disease frequent falls and urinary tract infections presents today after a fall.  She states she did not pass out she just "sat down quickly, EMS states someone was with her and she was helped to the floor.  No head injury, denies any pain at this time.  States that her shoulder was a little uncomfortable but now feels better.  No chest pain.  Patient is on Eliquis.  She states that she feels "pretty good" and she is not sure why she went down.  Level 5 chart caveat; no further history available due to patient status.    Past Medical History:  Diagnosis Date  . CHF (congestive heart failure) ([place])   . Depression   . GERD (gastroesophageal reflux disease)   . Hyperlipidemia   . Hypertension   . Thyroid disease     Patient Active Problem List   Diagnosis Date Noted  . Atrial fibrillation with RVR ([place]) 03/30/2015  . Adult idiopathic generalized osteoporosis 03/12/2015  . Anxiety 10/13/2014  . B12 deficiency 10/13/2014  . Breast CA ([place]) 10/13/2014  . Atherosclerosis of coronary artery 10/13/2014  . Chronic kidney disease (CKD), stage III (moderate) ([place]) 10/13/2014  . CCF (congestive cardiac failure) ([place]) 10/13/2014  . Clinical depression 10/13/2014  . DD (diverticular disease) 10/13/2014  . Bleeding gastrointestinal 10/13/2014  . Acid reflux 10/13/2014  . 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 Hypercholesteremia 10/13/2014  . BP (high blood pressure) 10/13/2014  . Decreased potassium in the blood 10/13/2014  . Adult hypothyroidism 10/13/2014   . Hypoxia 10/13/2014  . OP (osteoporosis) 10/13/2014  . Dementia ([place]) 10/13/2014  . Avitaminosis D 10/13/2014  . Involutional osteoporosis 01/02/2014  . Heart attack ([place]) 09/12/2013  . H/O cardiac catheterization 09/12/2013  . Brain tumor ([place]) 09/12/2013    Past Surgical History:  Procedure Laterality Date  . ABDOMINAL HYSTERECTOMY  1980  . ANGIOPLASTY     due to MI LV ef-40%  . cataract surgery Left   . CRANIOTOMY    . MASTECTOMY Left 02/26/1995   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, no chemo or radiation     Prior to Admission medications   Medication Sig Start Date End Date Taking? Authorizing Provider  acetaminophen (TYLENOL) 500 MG tablet Take 500-1,000 mg by mouth every 6 (six) hours as needed.     [provider]  Cholecalciferol (VITAMIN D HIGH POTENCY) 1000 UNITS capsule Take 4,000 Units by mouth daily.  06/12/10   [provider]  Cranberry-Vitamin C-Inulin (UTI-STAT) LIQD Take 1 capsule by mouth 2 (two) times daily.  11/24/11   [provider]  diltiazem (CARDIZEM CD) 240 MG 24 hr capsule Take 1 capsule (240 mg total) by mouth daily as needed. 04/13/15   Margarita Rana, MD  donepezil (ARICEPT) 10 MG tablet Take 10 mg by mouth at bedtime.    [provider]  ELIQUIS 2.5 MG TABS tablet TAKE (1) TABLET TWICE A DAY. 05/23/15   Margarita Rana, MD  escitalopram (LEXAPRO) 10 MG tablet Take 10 mg by mouth daily.     [provider]  Ferrous  Sulfate (IRON) 325 (65 FE) MG TABS Take 1 tablet by mouth 2 (two) times daily.     [provider]  furosemide (LASIX) 20 MG tablet Take 1 tablet (20 mg total) by mouth 2 (two) times daily. 01/29/15   Margarita Rana, MD  levothyroxine (SYNTHROID) 75 MCG tablet Take 1 tablet (75 mcg total) by mouth daily before breakfast. 12/05/14   Margarita Rana, MD  LORazepam (ATIVAN) 1 MG tablet Take 1 tablet (1 mg total) by mouth every 4 (four) hours as needed for anxiety. Patient taking  differently: Take 0.5-1 mg by mouth every 4 (four) hours as needed for anxiety.  03/03/15   Margarita Rana, MD  memantine (NAMENDA) 10 MG tablet Take 10 mg by mouth 2 (two) times daily.  01/29/11   [provider]  metoprolol succinate (TOPROL-XL) 25 MG 24 hr tablet Take 1 tablet (25 mg total) by mouth daily. 03/03/15   Margarita Rana, MD  omeprazole (PRILOSEC) 20 MG capsule Take 20 mg by mouth daily.     [provider]  OXYGEN 2 L.    [provider]  potassium chloride (MICRO-K) 10 MEQ CR capsule Take 1 capsule (10 mEq total) by mouth daily. 11/07/14   Margarita Rana, MD  ramipril (ALTACE) 5 MG capsule Take 1 capsule (5 mg total) by mouth daily. 04/02/15   Henreitta Leber, MD  simvastatin (ZOCOR) 20 MG tablet Take 1 tablet (20 mg total) by mouth at bedtime. 01/10/15   Margarita Rana, MD  vitamin C (ASCORBIC ACID) 500 MG tablet Take 1,000 mg by mouth daily.     [provider]    Allergies Codeine; Penicillins; and Phenytoin sodium extended  Family History  Problem Relation Age of Onset  . Hypertension Mother   . Cerebrovascular Accident Mother   . Heart disease Father     Social History Social History   Tobacco Use  . Smoking status: Never Smoker  . Smokeless tobacco: Never Used  Substance Use Topics  . Alcohol use: No  . Drug use: No    Review of Systems Constitutional: No fever/chills Eyes: No visual changes. ENT: No sore throat. No stiff neck no neck pain Cardiovascular: Denies chest pain. Respiratory: Denies shortness of breath. Gastrointestinal:   no vomiting.  No diarrhea.  No constipation. Genitourinary: Negative for dysuria. Musculoskeletal: Negative lower extremity swelling Skin: Negative for rash. Neurological: Negative for severe headaches, focal weakness or numbness.   ____________________________________________   PHYSICAL EXAM:  VITAL SIGNS: ED Triage Vitals  Enc Vitals Group     BP 03/17/18 1915 (!) 108/52      Pulse Rate 03/17/18 1915 80     Resp 03/17/18 1915 14     Temp 03/17/18 1915 (!) 97.5 F (36.4 C)     Temp Source 03/17/18 1915 Oral     SpO2 03/17/18 1915 93 %     Weight 03/17/18 1920 121 lb 4.1 oz (55 kg)     Height 03/17/18 1920 5\' 2"  (1.575 m)     Head Circumference --      Peak Flow --      Pain Score 03/17/18 1920 4     Pain Loc --      Pain Edu? --      Excl. in [place]? --     Constitutional: Alert and oriented to name and place unsure of the date. Well appearing and in no acute distress. Eyes: Conjunctivae are normal Head: Atraumatic HEENT: No congestion/rhinnorhea. Mucous membranes are moist.  Oropharynx non-erythematous Neck:   Nontender with no meningismus, no masses, no stridor Cardiovascular: Normal rate, regular rhythm. Grossly normal heart sounds.  Good peripheral circulation. Respiratory: Normal respiratory effort.  No retractions. Lungs CTAB. Abdominal: Soft and nontender. No distention. No guarding no rebound Back:  There is no focal tenderness or step off.  there is no midline tenderness there are no lesions noted. there is no CVA tenderness Musculoskeletal: No lower extremity tenderness, no upper extremity tenderness. No joint effusions, no DVT signs strong distal pulses no edema Neurologic:  Normal speech and language. No gross focal neurologic deficits are appreciated.  Skin:  Skin is warm, dry and intact. No rash noted. Psychiatric: Mood and affect are normal. Speech and behavior are normal.  ____________________________________________   LABS (all labs ordered are listed, but only abnormal results are displayed)  Labs Reviewed  CBC WITH DIFFERENTIAL/PLATELET  COMPREHENSIVE METABOLIC PANEL  TROPONIN I  URINALYSIS, COMPLETE (UACMP) WITH MICROSCOPIC    Pertinent labs  results that were available during my care of the patient were reviewed by me and considered in my medical decision making (see chart for  details). ____________________________________________  EKG  I personally interpreted any EKGs ordered by me or triage Atrial fibrillation, rate 75 no acute ST elevation or depression nonspecific ST changes normal axis, patient does have a history of atrial fibrillation and is on Eliquis ____________________________________________  RADIOLOGY  Pertinent labs & imaging results that were available during my care of the patient were reviewed by me and considered in my medical decision making (see chart for details). If possible, patient and/or family made aware of any abnormal findings.  No results found. ____________________________________________    PROCEDURES  Procedure(s) performed: None  Procedures  Critical Care performed: None  ____________________________________________   INITIAL IMPRESSION / ASSESSMENT AND PLAN / ED COURSE  Pertinent labs & imaging results that were available during my care of the patient were reviewed by me and considered in my medical decision making (see chart for details).  Looking 84 year old woman who felt weak and sat down rapidly today.  No obvious evidence of injury.  Is unclear if she completely passed out or not.  She states she does not think she did but she is not sure.  We will do a large work-up on her including urine blood EKG chest x-ray, there is no evidence of head injury and she is adamant she did not hit her head.  However she is on blood thinners we will obtain imaging and reassess  ----------------------------------------- 8:29 PM on 03/17/2018 -----------------------------------------  We are here, they report that the patient did not actually SI but she became "woozy", and is at her baseline at this time.  They also state that she did not hit her head to the extent that they can tell.  Her white count is up but chronically so, her electrolytes are reassuring creatinine is at his baseline, if urinalysis and chest x-ray and CT are  reassuring is my hope living get her home.  Signed out to Dr. Alfred Levins at the end of the shift    ____________________________________________   FINAL CLINICAL IMPRESSION(S) / ED DIAGNOSES  Final diagnoses:  None      This chart was dictated using voice recognition software.  Despite best efforts to proofread,  errors can occur which can change meaning.      Schuyler Amor, MD 03/17/18 [place]    Schuyler Amor, MD 03/17/18 2030

## 2018-03-17 NOTE — ED Provider Notes (Signed)
-----------------------------------------   11:20 PM on 03/17/2018 -----------------------------------------   Blood pressure 126/63, pulse 76, temperature (!) 97.5 F (36.4 C), temperature source Oral, resp. rate 14, height 5\' 2"  (1.575 m), weight 55 kg, SpO2 94 %.  Assuming care from Dr. Burlene Arnt of [facility] is a 84 y.o. female with a chief complaint of Loss of Consciousness .    Please refer to H&P by previous MD for further details.  The current plan of care is to f/u UA.  UA positive for UTI. Patient given rocephin and dc home on keflex. Standard return precautions discussed with patient        Rudene Re, MD 03/17/18 2321

## 2018-03-17 NOTE — Discharge Instructions (Signed)

## 2018-03-17 NOTE — ED Notes (Signed)
EKG completed

## 2018-03-17 NOTE — ED Notes (Signed)
Lav/Grn top sent to lab.

## 2018-03-17 NOTE — ED Notes (Signed)
Urine sample sent to lab

## 2018-03-17 NOTE — ED Notes (Signed)
I/O cath completed

## 2018-03-17 NOTE — ED Triage Notes (Signed)
Pt brought in via EMS for syncopal episode in the shower. Assisted to floor by home health. Complains of L shoulder pain. BP with EMS 105/70; BG 212; 100 NS bolus; 20g L ac; NSR; HR 70-85; hist dementia per EMS.

## 2018-03-20 LAB — URINE CULTURE

## 2018-09-29 ENCOUNTER — Emergency Department: Payer: Medicare (Managed Care)

## 2018-09-29 ENCOUNTER — Inpatient Hospital Stay: Payer: Medicare (Managed Care)

## 2018-09-29 ENCOUNTER — Other Ambulatory Visit: Payer: Self-pay

## 2018-09-29 ENCOUNTER — Encounter: Payer: Self-pay | Admitting: Radiology

## 2018-09-29 ENCOUNTER — Inpatient Hospital Stay
Admission: EM | Admit: 2018-09-29 | Discharge: 2018-10-04 | DRG: 481 | Disposition: A | Discharge status: Skilled Nursing Facility | Payer: Medicare (Managed Care) | Attending: Internal Medicine | Admitting: Internal Medicine

## 2018-09-29 DIAGNOSIS — K219 Gastro-esophageal reflux disease without esophagitis: Secondary | ICD-10-CM | POA: Diagnosis present

## 2018-09-29 DIAGNOSIS — Z1159 Encounter for screening for other viral diseases: Secondary | ICD-10-CM | POA: Diagnosis not present

## 2018-09-29 DIAGNOSIS — T508X5A Adverse effect of diagnostic agents, initial encounter: Secondary | ICD-10-CM | POA: Diagnosis not present

## 2018-09-29 DIAGNOSIS — D62 Acute posthemorrhagic anemia: Secondary | ICD-10-CM | POA: Diagnosis not present

## 2018-09-29 DIAGNOSIS — Z66 Do not resuscitate: Secondary | ICD-10-CM | POA: Diagnosis not present

## 2018-09-29 DIAGNOSIS — Z9861 Coronary angioplasty status: Secondary | ICD-10-CM

## 2018-09-29 DIAGNOSIS — Z9012 Acquired absence of left breast and nipple: Secondary | ICD-10-CM

## 2018-09-29 DIAGNOSIS — I130 Hypertensive heart and chronic kidney disease with heart failure and stage 1 through stage 4 chronic kidney disease, or unspecified chronic kidney disease: Secondary | ICD-10-CM | POA: Diagnosis present

## 2018-09-29 DIAGNOSIS — I5022 Chronic systolic (congestive) heart failure: Secondary | ICD-10-CM | POA: Diagnosis present

## 2018-09-29 DIAGNOSIS — Z79899 Other long term (current) drug therapy: Secondary | ICD-10-CM

## 2018-09-29 DIAGNOSIS — I4891 Unspecified atrial fibrillation: Secondary | ICD-10-CM

## 2018-09-29 DIAGNOSIS — W19XXXA Unspecified fall, initial encounter: Secondary | ICD-10-CM | POA: Diagnosis present

## 2018-09-29 DIAGNOSIS — I2510 Atherosclerotic heart disease of native coronary artery without angina pectoris: Secondary | ICD-10-CM | POA: Diagnosis present

## 2018-09-29 DIAGNOSIS — N183 Chronic kidney disease, stage 3 (moderate): Secondary | ICD-10-CM | POA: Diagnosis present

## 2018-09-29 DIAGNOSIS — T148XXA Other injury of unspecified body region, initial encounter: Secondary | ICD-10-CM

## 2018-09-29 DIAGNOSIS — S52572A Other intraarticular fracture of lower end of left radius, initial encounter for closed fracture: Secondary | ICD-10-CM | POA: Diagnosis present

## 2018-09-29 DIAGNOSIS — Z885 Allergy status to narcotic agent status: Secondary | ICD-10-CM

## 2018-09-29 DIAGNOSIS — S2242XA Multiple fractures of ribs, left side, initial encounter for closed fracture: Secondary | ICD-10-CM | POA: Diagnosis present

## 2018-09-29 DIAGNOSIS — N141 Nephropathy induced by other drugs, medicaments and biological substances: Secondary | ICD-10-CM | POA: Diagnosis not present

## 2018-09-29 DIAGNOSIS — E785 Hyperlipidemia, unspecified: Secondary | ICD-10-CM | POA: Diagnosis present

## 2018-09-29 DIAGNOSIS — F329 Major depressive disorder, single episode, unspecified: Secondary | ICD-10-CM | POA: Diagnosis present

## 2018-09-29 DIAGNOSIS — I480 Paroxysmal atrial fibrillation: Secondary | ICD-10-CM | POA: Diagnosis present

## 2018-09-29 DIAGNOSIS — Z888 Allergy status to other drugs, medicaments and biological substances status: Secondary | ICD-10-CM

## 2018-09-29 DIAGNOSIS — Z7901 Long term (current) use of anticoagulants: Secondary | ICD-10-CM | POA: Diagnosis not present

## 2018-09-29 DIAGNOSIS — R54 Age-related physical debility: Secondary | ICD-10-CM | POA: Diagnosis present

## 2018-09-29 DIAGNOSIS — N390 Urinary tract infection, site not specified: Secondary | ICD-10-CM | POA: Diagnosis present

## 2018-09-29 DIAGNOSIS — Y92031 Bathroom in apartment as the place of occurrence of the external cause: Secondary | ICD-10-CM | POA: Diagnosis not present

## 2018-09-29 DIAGNOSIS — E039 Hypothyroidism, unspecified: Secondary | ICD-10-CM | POA: Diagnosis present

## 2018-09-29 DIAGNOSIS — S52502A Unspecified fracture of the lower end of left radius, initial encounter for closed fracture: Secondary | ICD-10-CM

## 2018-09-29 DIAGNOSIS — I252 Old myocardial infarction: Secondary | ICD-10-CM

## 2018-09-29 DIAGNOSIS — Z79890 Hormone replacement therapy (postmenopausal): Secondary | ICD-10-CM

## 2018-09-29 DIAGNOSIS — Z880 Allergy status to penicillin: Secondary | ICD-10-CM

## 2018-09-29 DIAGNOSIS — I255 Ischemic cardiomyopathy: Secondary | ICD-10-CM | POA: Diagnosis present

## 2018-09-29 DIAGNOSIS — Z823 Family history of stroke: Secondary | ICD-10-CM

## 2018-09-29 DIAGNOSIS — N179 Acute kidney failure, unspecified: Secondary | ICD-10-CM | POA: Diagnosis not present

## 2018-09-29 DIAGNOSIS — S72142A Displaced intertrochanteric fracture of left femur, initial encounter for closed fracture: Secondary | ICD-10-CM | POA: Diagnosis present

## 2018-09-29 DIAGNOSIS — F0390 Unspecified dementia without behavioral disturbance: Secondary | ICD-10-CM | POA: Diagnosis present

## 2018-09-29 DIAGNOSIS — Z90710 Acquired absence of both cervix and uterus: Secondary | ICD-10-CM

## 2018-09-29 DIAGNOSIS — Z8249 Family history of ischemic heart disease and other diseases of the circulatory system: Secondary | ICD-10-CM

## 2018-09-29 LAB — COMPREHENSIVE METABOLIC PANEL
ALT: 17 U/L (ref 0–44)
AST: 32 U/L (ref 15–41)
Albumin: 4.2 g/dL (ref 3.5–5.0)
Alkaline Phosphatase: 42 U/L (ref 38–126)
Anion gap: 17 — ABNORMAL HIGH (ref 5–15)
BUN: 30 mg/dL — ABNORMAL HIGH (ref 8–23)
CO2: 21 mmol/L — ABNORMAL LOW (ref 22–32)
Calcium: 9 mg/dL (ref 8.9–10.3)
Chloride: 104 mmol/L (ref 98–111)
Creatinine, Ser: 1.33 mg/dL — ABNORMAL HIGH (ref 0.44–1.00)
GFR calc Af Amer: 40 mL/min — ABNORMAL LOW (ref >60)
GFR calc non Af Amer: 35 mL/min — ABNORMAL LOW (ref >60)
Glucose, Bld: 280 mg/dL — ABNORMAL HIGH (ref 70–99)
Potassium: 3.8 mmol/L (ref 3.5–5.1)
Sodium: 142 mmol/L (ref 135–145)
Total Bilirubin: 1.7 mg/dL — ABNORMAL HIGH (ref 0.3–1.2)
Total Protein: 6.8 g/dL (ref 6.5–8.1)

## 2018-09-29 LAB — CBC WITH DIFFERENTIAL/PLATELET
Abs Immature Granulocytes: 0.11 10*3/uL — ABNORMAL HIGH (ref 0.00–0.07)
Basophils Absolute: 0 10*3/uL (ref 0.0–0.1)
Basophils Relative: 0 %
Eosinophils Absolute: 0.1 10*3/uL (ref 0.0–0.5)
Eosinophils Relative: 1 %
HCT: 34.6 % — ABNORMAL LOW (ref 36.0–46.0)
Hemoglobin: 11.3 g/dL — ABNORMAL LOW (ref 12.0–15.0)
Immature Granulocytes: 1 %
Lymphocytes Relative: 3 %
Lymphs Abs: 0.6 10*3/uL — ABNORMAL LOW (ref 0.7–4.0)
MCH: 31.3 pg (ref 26.0–34.0)
MCHC: 32.7 g/dL (ref 30.0–36.0)
MCV: 95.8 fL (ref 80.0–100.0)
Monocytes Absolute: 1.3 10*3/uL — ABNORMAL HIGH (ref 0.1–1.0)
Monocytes Relative: 6 %
Neutro Abs: 18.9 10*3/uL — ABNORMAL HIGH (ref 1.7–7.7)
Neutrophils Relative %: 89 %
Platelets: 296 10*3/uL (ref 150–400)
RBC: 3.61 MIL/uL — ABNORMAL LOW (ref 3.87–5.11)
RDW: 14.7 % (ref 11.5–15.5)
WBC: 21.1 10*3/uL — ABNORMAL HIGH (ref 4.0–10.5)
nRBC: 0 % (ref 0.0–0.2)

## 2018-09-29 LAB — URINALYSIS, COMPLETE (UACMP) WITH MICROSCOPIC
Bilirubin Urine: NEGATIVE
Glucose, UA: NEGATIVE mg/dL
Hgb urine dipstick: NEGATIVE
Ketones, ur: 20 mg/dL — AB
Nitrite: NEGATIVE
Protein, ur: NEGATIVE mg/dL
Specific Gravity, Urine: 1.018 (ref 1.005–1.030)
Squamous Epithelial / HPF: NONE SEEN (ref 0–5)
pH: 5 (ref 5.0–8.0)

## 2018-09-29 LAB — APTT: aPTT: 29 seconds (ref 24–36)

## 2018-09-29 LAB — TROPONIN I: Troponin I: <0.03 ng/mL (ref <0.03)

## 2018-09-29 LAB — PROTIME-INR
INR: 1.1 (ref 0.8–1.2)
Prothrombin Time: 14.5 seconds (ref 11.4–15.2)

## 2018-09-29 LAB — SARS CORONAVIRUS 2 BY RT PCR (HOSPITAL ORDER, PERFORMED IN ~~LOC~~ HOSPITAL LAB): SARS Coronavirus 2: NEGATIVE

## 2018-09-29 MED ORDER — HYDROCODONE-ACETAMINOPHEN 5-325 MG PO TABS
1.0000 | ORAL_TABLET | Freq: Four times a day (QID) | ORAL | Status: DC | PRN
  Administered 2018-09-30 (×2): 1 via ORAL
  Filled 2018-09-29 (×3): qty 1

## 2018-09-29 MED ORDER — RAMIPRIL 5 MG PO CAPS
5.0000 mg | ORAL_CAPSULE | Freq: Every day | ORAL | Status: DC
  Administered 2018-10-01: 5 mg via ORAL
  Filled 2018-09-29 (×2): qty 1

## 2018-09-29 MED ORDER — CEFAZOLIN SODIUM-DEXTROSE 1-4 GM/50ML-% IV SOLN
1.0000 g | Freq: Once | INTRAVENOUS | Status: AC
  Administered 2018-09-29: 22:00:00 1 g via INTRAVENOUS
  Filled 2018-09-29: qty 50

## 2018-09-29 MED ORDER — FUROSEMIDE 20 MG PO TABS
20.0000 mg | ORAL_TABLET | Freq: Two times a day (BID) | ORAL | Status: DC
  Administered 2018-10-01 (×2): 20 mg via ORAL
  Filled 2018-09-29 (×2): qty 1

## 2018-09-29 MED ORDER — PANTOPRAZOLE SODIUM 40 MG PO TBEC
40.0000 mg | DELAYED_RELEASE_TABLET | Freq: Every day | ORAL | Status: DC
  Administered 2018-10-01 – 2018-10-04 (×4): 40 mg via ORAL
  Filled 2018-09-29 (×4): qty 1

## 2018-09-29 MED ORDER — MORPHINE SULFATE (PF) 2 MG/ML IV SOLN: 0.5000 mg | INTRAVENOUS | Status: DC | PRN

## 2018-09-29 MED ORDER — POLYETHYLENE GLYCOL 3350 17 G PO PACK
17.0000 g | PACK | Freq: Every day | ORAL | Status: DC | PRN
  Administered 2018-10-04: 17 g via ORAL
  Filled 2018-09-29: qty 1

## 2018-09-29 MED ORDER — ESCITALOPRAM OXALATE 10 MG PO TABS
10.0000 mg | ORAL_TABLET | Freq: Two times a day (BID) | ORAL | Status: DC
  Administered 2018-09-30 – 2018-10-04 (×8): 10 mg via ORAL
  Filled 2018-09-29 (×12): qty 1

## 2018-09-29 MED ORDER — DILTIAZEM HCL 100 MG IV SOLR
5.0000 mg/h | INTRAVENOUS | Status: DC
  Administered 2018-09-29: 22:00:00 5 mg/h via INTRAVENOUS
  Filled 2018-09-29: qty 100

## 2018-09-29 MED ORDER — POTASSIUM CHLORIDE CRYS ER 10 MEQ PO TBCR
10.0000 meq | EXTENDED_RELEASE_TABLET | Freq: Every day | ORAL | Status: DC
  Administered 2018-10-01: 10 meq via ORAL
  Filled 2018-09-29: qty 1

## 2018-09-29 MED ORDER — DILTIAZEM HCL 25 MG/5ML IV SOLN
5.0000 mg | Freq: Once | INTRAVENOUS | Status: AC
  Administered 2018-09-29: 5 mg via INTRAVENOUS
  Filled 2018-09-29: qty 5

## 2018-09-29 MED ORDER — LIDOCAINE 5 % EX PTCH
1.0000 | MEDICATED_PATCH | CUTANEOUS | Status: DC
  Administered 2018-09-29 – 2018-10-03 (×4): 1 via TRANSDERMAL
  Filled 2018-09-29 (×7): qty 1

## 2018-09-29 MED ORDER — SODIUM CHLORIDE 0.9 % IV SOLN
1.0000 g | Freq: Once | INTRAVENOUS | Status: AC
  Administered 2018-09-29: 1 g via INTRAVENOUS
  Filled 2018-09-29: qty 10

## 2018-09-29 MED ORDER — FENTANYL CITRATE (PF) 100 MCG/2ML IJ SOLN
25.0000 ug | INTRAMUSCULAR | Status: DC | PRN
  Administered 2018-09-29: 25 ug via INTRAVENOUS
  Filled 2018-09-29: qty 2

## 2018-09-29 MED ORDER — SIMVASTATIN 20 MG PO TABS
20.0000 mg | ORAL_TABLET | Freq: Every day | ORAL | Status: DC
  Administered 2018-09-30: 20 mg via ORAL
  Filled 2018-09-29: qty 1

## 2018-09-29 MED ORDER — DONEPEZIL HCL 5 MG PO TABS
10.0000 mg | ORAL_TABLET | Freq: Every day | ORAL | Status: DC
  Administered 2018-09-30 – 2018-10-03 (×5): 10 mg via ORAL
  Filled 2018-09-29 (×6): qty 2

## 2018-09-29 MED ORDER — SODIUM CHLORIDE 0.9 % IV SOLN
1.0000 g | INTRAVENOUS | Status: DC
  Administered 2018-09-30: 1 g via INTRAVENOUS
  Filled 2018-09-29: qty 10
  Filled 2018-09-29: qty 1
  Filled 2018-09-29: qty 10

## 2018-09-29 MED ORDER — FENTANYL CITRATE (PF) 100 MCG/2ML IJ SOLN
50.0000 ug | Freq: Once | INTRAMUSCULAR | Status: AC
  Administered 2018-09-29: 19:00:00 50 ug via INTRAVENOUS
  Filled 2018-09-29: qty 2

## 2018-09-29 MED ORDER — DILTIAZEM HCL ER COATED BEADS 240 MG PO CP24: 240.0000 mg | ORAL_CAPSULE | Freq: Every day | ORAL | Status: DC

## 2018-09-29 MED ORDER — LEVOTHYROXINE SODIUM 50 MCG PO TABS
75.0000 ug | ORAL_TABLET | Freq: Every day | ORAL | Status: DC
  Administered 2018-09-30 – 2018-10-04 (×4): 75 ug via ORAL
  Filled 2018-09-29 (×4): qty 1

## 2018-09-29 MED ORDER — METOPROLOL SUCCINATE ER 25 MG PO TB24: 25.0000 mg | ORAL_TABLET | Freq: Every day | ORAL | Status: DC

## 2018-09-29 MED ORDER — ONDANSETRON HCL 4 MG/2ML IJ SOLN
4.0000 mg | Freq: Once | INTRAMUSCULAR | Status: AC
  Administered 2018-09-29: 4 mg via INTRAVENOUS
  Filled 2018-09-29: qty 2

## 2018-09-29 MED ORDER — IOHEXOL 300 MG/ML  SOLN
60.0000 mL | Freq: Once | INTRAMUSCULAR | Status: AC | PRN
  Administered 2018-09-29: 60 mL via INTRAVENOUS

## 2018-09-29 MED ORDER — VITAMIN C 500 MG PO TABS
1000.0000 mg | ORAL_TABLET | Freq: Every day | ORAL | Status: DC
  Administered 2018-10-01 – 2018-10-04 (×5): 1000 mg via ORAL
  Filled 2018-09-29 (×4): qty 2

## 2018-09-29 MED ORDER — MEMANTINE HCL 5 MG PO TABS
10.0000 mg | ORAL_TABLET | Freq: Two times a day (BID) | ORAL | Status: DC
  Administered 2018-09-30: 02:00:00 10 mg via ORAL
  Filled 2018-09-29: qty 2

## 2018-09-29 MED ORDER — DILTIAZEM HCL ER COATED BEADS 120 MG PO CP24
240.0000 mg | ORAL_CAPSULE | Freq: Once | ORAL | Status: DC
  Filled 2018-09-29 (×3): qty 1

## 2018-09-29 NOTE — ED Notes (Signed)
Per EMS she was found on the floor   Unknown down time    Bruising with possible deformity noted to left wrist  Also deformity of left hip

## 2018-09-29 NOTE — ED Notes (Signed)
Patient repositioned and placed on 2L Nasal Cannula.

## 2018-09-29 NOTE — ED Notes (Signed)
ED TO INPATIENT HANDOFF REPORT  ED Nurse Name and Phone #:  Holly Burns [phone]  S Name/Age/Gender Holly Burns 85 y.o. female Room/Bed: ED04A/ED04A  Code Status   Code Status: Full Code  Home/SNF/Other Nursing Home Patient oriented to: self Is this baseline? Yes   Triage Complete: Triage complete  Chief Complaint left hip pain  Triage Note Pt to ED after a fall at home. Pt was left on the ground for multiple hours. Pt presents to ED with obvious hip deformity to left hip. Leg is shortened and rotated. Pedal pulse intact and color appropriate. Swelling, bruising and deformity to left arm as well.    Allergies Allergies  Allergen Reactions  . Codeine Other (See Comments)    Reaction: unknown  . Penicillins Other (See Comments)    Unknown reaction and unable to answer follow-up questions  . Phenytoin Sodium Extended Other (See Comments)    Hyperthermia    Burns of Care/Admitting Diagnosis ED Disposition    ED Disposition Condition [facility] Area: [place] [[id number]]  Burns of Care: Med-Surg [16]  Covid Evaluation: Confirmed COVID Negative  Diagnosis: Fall [[id number]]  Admitting Physician: Holly Burns [[id number]]  Attending Physician: Holly Burns [[id number]]  Estimated length of stay: past midnight tomorrow  Certification:: I certify this patient will need inpatient services for at least 2 midnights  PT Class (Do Not Modify): Inpatient [101]  PT Acc Code (Do Not Modify): Private [1]       B Medical/Surgery History Past Medical History:  Diagnosis Date  . CHF (congestive heart failure) ([place])   . Depression   . GERD (gastroesophageal reflux disease)   . Hyperlipidemia   . Hypertension   . Thyroid disease    Past Surgical History:  Procedure Laterality Date  . ABDOMINAL HYSTERECTOMY  1980  . ANGIOPLASTY     due to MI LV ef-40%  . cataract surgery Left   . CRANIOTOMY    . 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   09/29/18    1944    Latex   less than 1          Intake/Output Last 24 hours  Intake/Output Summary (Last 24 hours) at 09/29/2018 2344 Last data filed at 09/29/2018 2307 Gross per 24 hour  Intake 50 ml  Output -  Net 50 ml    Labs/Imaging Results for orders placed or performed during the hospital encounter of 09/29/18 (from the past 48 hour(s))  CBC with Differential     Status: Abnormal   Collection Time: 09/29/18  6:01 PM  Result Value Ref Range   WBC 21.1 (H) 4.0 - 10.5 K/uL   RBC 3.61 (L) 3.87 - 5.11 MIL/uL   Hemoglobin 11.3 (L) 12.0 - 15.0 g/dL   HCT 34.6 (L) 36.0 - 46.0 %   MCV 95.8 80.0 - 100.0 fL  MCH 31.3 26.0 - 34.0 pg   MCHC 32.7 30.0 - 36.0 g/dL   RDW 14.7 11.5 - 15.5 %   Platelets 296 150 - 400 K/uL   nRBC 0.0 0.0 - 0.2 %   Neutrophils Relative % 89 %   Neutro Abs 18.9 (H) 1.7 - 7.7 K/uL   Lymphocytes Relative 3 %   Lymphs Abs 0.6 (L) 0.7 - 4.0 K/uL   Monocytes Relative 6 %   Monocytes Absolute 1.3 (H) 0.1 - 1.0 K/uL   Eosinophils Relative 1 %   Eosinophils Absolute 0.1 0.0 - 0.5 K/uL   Basophils Relative 0 %   Basophils Absolute 0.0 0.0 - 0.1 K/uL   Immature Granulocytes 1 %   Abs Immature Granulocytes 0.11 (H) 0.00 - 0.07 K/uL    Comment: Performed at [facility], [address] [place], [place] [address]  Urinalysis, Complete w Microscopic     Status: Abnormal   Collection Time: 09/29/18  6:01 PM  Result Value Ref Range   Color, Urine YELLOW (A) YELLOW   APPearance CLOUDY (A)  CLEAR   Specific Gravity, Urine 1.018 1.005 - 1.030   pH 5.0 5.0 - 8.0   Glucose, UA NEGATIVE NEGATIVE mg/dL   Hgb urine dipstick NEGATIVE NEGATIVE   Bilirubin Urine NEGATIVE NEGATIVE   Ketones, ur 20 (A) NEGATIVE mg/dL   Protein, ur NEGATIVE NEGATIVE mg/dL   Nitrite NEGATIVE NEGATIVE   Leukocytes,Ua TRACE (A) NEGATIVE   RBC / HPF 0-5 0 - 5 RBC/hpf   WBC, UA 0-5 0 - 5 WBC/hpf   Bacteria, UA MANY (A) NONE SEEN   Squamous Epithelial / LPF NONE SEEN 0 - 5   Mucus PRESENT    Hyaline Casts, UA PRESENT    Amorphous Crystal PRESENT     Comment: Performed at [facility], [address] [place], [place] [address]  Type and screen [place]     Status: None (Preliminary result)   Collection Time: 09/29/18  6:01 PM  Result Value Ref Range   ABO/RH(D) PENDING    Antibody Screen PENDING    Sample Expiration      10/02/2018,2359 Performed at [facility], [address] [place], [place] [address]   Protime-INR     Status: None   Collection Time: 09/29/18  6:01 PM  Result Value Ref Range   Prothrombin Time 14.5 11.4 - 15.2 seconds   INR 1.1 0.8 - 1.2    Comment: (NOTE) INR goal varies based on device and disease states. Performed at [facility], [address] [place], [place] [address]   APTT     Status: None   Collection Time: 09/29/18  6:01 PM  Result Value Ref Range   aPTT 29 24 - 36 seconds    Comment: Performed at [facility], [address] [place], [place] [address]  Type and screen     Status: None (Preliminary result)   Collection Time: 09/29/18  7:03 PM  Result Value Ref Range   ABO/RH(D) PENDING    Antibody Screen PENDING    Sample Expiration      10/02/2018,2359 Performed at [facility], [address] [place], [place] [address]   Comprehensive metabolic panel     Status: Abnormal   Collection Time: 09/29/18  7:03 PM  Result Value Ref Range   Sodium 142 135 - 145 mmol/L   Potassium  3.8 3.5 - 5.1 mmol/L   Chloride 104 98 - 111  mmol/L   CO2 21 (L) 22 - 32 mmol/L   Glucose, Bld 280 (H) 70 - 99 mg/dL   BUN 30 (H) 8 - 23 mg/dL   Creatinine, Ser 1.33 (H) 0.44 - 1.00 mg/dL   Calcium 9.0 8.9 - 10.3 mg/dL   Total Protein 6.8 6.5 - 8.1 g/dL   Albumin 4.2 3.5 - 5.0 g/dL   AST 32 15 - 41 U/L   ALT 17 0 - 44 U/L   Alkaline Phosphatase 42 38 - 126 U/L   Total Bilirubin 1.7 (H) 0.3 - 1.2 mg/dL   GFR calc non Af Amer 35 (L) >60 mL/min   GFR calc Af Amer 40 (L) >60 mL/min   Anion gap 17 (H) 5 - 15    Comment: Performed at [facility], [address] [place], [place] [address]  Troponin I -     Status: None   Collection Time: 09/29/18  7:03 PM  Result Value Ref Range   Troponin I <0.03 <0.03 ng/mL    Comment: Performed at [facility], [address] [place], [place] [address]  SARS Coronavirus 2 (CEPHEID - Performed in [facility] hospital lab), Hosp Order     Status: None   Collection Time: 09/29/18  7:04 PM   Specimen: Nasopharyngeal Swab  Result Value Ref Range   SARS Coronavirus 2 NEGATIVE NEGATIVE    Comment: (NOTE) If result is NEGATIVE SARS-CoV-2 target nucleic acids are NOT DETECTED. The SARS-CoV-2 RNA is generally detectable in upper and lower  respiratory specimens during the acute phase of infection. The lowest  concentration of SARS-CoV-2 viral copies this assay can detect is 250  copies / mL. A negative result does not preclude SARS-CoV-2 infection  and should not be used as the sole basis for treatment or other  patient management decisions.  A negative result may occur with  improper specimen collection / handling, submission of specimen other  than nasopharyngeal swab, presence of viral mutation(s) within the  areas targeted by this assay, and inadequate number of viral copies  (<250 copies / mL). A negative result must be combined with clinical  observations, patient history, and epidemiological information. If result is  POSITIVE SARS-CoV-2 target nucleic acids are DETECTED. The SARS-CoV-2 RNA is generally detectable in upper and lower  respiratory specimens dur ing the acute phase of infection.  Positive  results are indicative of active infection with SARS-CoV-2.  Clinical  correlation with patient history and other diagnostic information is  necessary to determine patient infection status.  Positive results do  not rule out bacterial infection or co-infection with other viruses. If result is PRESUMPTIVE POSTIVE SARS-CoV-2 nucleic acids MAY BE PRESENT.   A presumptive positive result was obtained on the submitted specimen  and confirmed on repeat testing.  While 2019 novel coronavirus  (SARS-CoV-2) nucleic acids may be present in the submitted sample  additional confirmatory testing may be necessary for epidemiological  and / or clinical management purposes  to differentiate between  SARS-CoV-2 and other Sarbecovirus currently known to infect humans.  If clinically indicated additional testing with an alternate test  methodology [phone]) is advised. The SARS-CoV-2 RNA is generally  detectable in upper and lower respiratory sp ecimens during the acute  phase of infection. The expected result is Negative. Fact Sheet for Patients:  StrictlyIdeas.no Fact Sheet for Healthcare Providers: BankingDealers.co.za This test is not yet approved or cleared by the [place] FDA and has been authorized for detection and/or diagnosis of  SARS-CoV-2 by FDA under an Emergency Use Authorization (EUA).  This EUA will remain in effect (meaning this test can be used) for the duration of the COVID-19 declaration under Section 564(b)(1) of the Act, 21 U.S.C. section 360bbb-3(b)(1), unless the authorization is terminated or revoked sooner. Performed at [facility], [address] [place], [place] [address]   Type and screen Ordered by PROVIDER DEFAULT      Status: None (Preliminary result)   Collection Time: 09/29/18  7:31 PM  Result Value Ref Range   ABO/RH(D) A POS    Antibody Screen POS    Sample Expiration 10/02/2018,2359    Antibody Identification ANTI FYA ([place] a)    Unit Number [id number]    Blood Component Type RBC, LR IRR    Unit division 00    Status of Unit ALLOCATED    Transfusion Status OK TO TRANSFUSE    Crossmatch Result      COMPATIBLE Performed at [facility], [address] [place], [place] [address]    Unit Number [id number]    Blood Component Type RED CELLS,LR    Unit division 00    Status of Unit ALLOCATED    Transfusion Status OK TO TRANSFUSE    Crossmatch Result COMPATIBLE    Dg Chest 1 View  Result Date: 09/29/2018 CLINICAL DATA:  Pain status post fall. EXAM: CHEST  1 VIEW COMPARISON:  03/29/2015 FINDINGS: Heart size is enlarged. There are aortic calcifications. Again noted are findings suspicious for a hiatal hernia. There is no acute osseous abnormality. Surgical clips are noted along the patient's left axilla. No large pleural effusion. No focal infiltrate. There are minimally displaced fractures involving the 6 through eighth ribs on the left. There is a deep sulcus sign on the left. IMPRESSION: 1. Acute minimally displaced fractures involving the sixth through eighth ribs on the left. There is a deep sulcus on the left concerning for left-sided pneumothorax. Consider upright radiograph for further evaluation of this finding or a follow-up CT. 2. Mild cardiomegaly with aortic calcifications. These results were called by telephone at the time of interpretation on 09/29/2018 at 7:04 pm to [facility] , who verbally acknowledged these results. Electronically Signed   By: Constance Holster M.D.   On: 09/29/2018 19:07   Dg Wrist Complete Left  Result Date: 09/29/2018 CLINICAL DATA:  Pain status post fall EXAM: LEFT WRIST - COMPLETE 3+ VIEW COMPARISON:  None. FINDINGS: There is an acute  impacted intra-articular fracture of the distal radius. There is some mild dorsal angulation of the distal fracture fragment. There is an acute minimally displaced fracture of the ulnar styloid process. There are advanced degenerative changes of the first carpometacarpal joint. There is osteopenia. IMPRESSION: 1. Acute impacted intra-articular fracture of the distal radius. 2. Acute minimally displaced fracture of the ulnar styloid process. 3. Osteopenia Electronically Signed   By: Constance Holster M.D.   On: 09/29/2018 19:09   Ct Head Wo Contrast  Result Date: 09/29/2018 CLINICAL DATA:  Status post fall, leg deformity EXAM: CT HEAD WITHOUT CONTRAST CT CERVICAL SPINE WITHOUT CONTRAST TECHNIQUE: Multidetector CT imaging of the head and cervical spine was performed following the standard protocol without intravenous contrast. Multiplanar CT image reconstructions of the cervical spine were also generated. COMPARISON:  None. FINDINGS: CT HEAD FINDINGS Brain: No evidence of acute infarction, hemorrhage, extra-axial collection, ventriculomegaly, or mass effect. Generalized cerebral atrophy. Periventricular white matter low attenuation likely secondary to microangiopathy. Vascular: Cerebrovascular atherosclerotic calcifications are noted. Skull: Negative  for fracture or focal lesion. Prior right frontoparietal craniotomy. Sinuses/Orbits: Visualized portions of the orbits are unremarkable. Visualized portions of the paranasal sinuses and mastoid air cells are unremarkable. Other: None. CT CERVICAL SPINE FINDINGS Alignment: Normal. Skull base and vertebrae: No acute compression fracture. Chronic T1 vertebral body compression fracture. No primary bone lesion or focal pathologic process. Soft tissues and spinal canal: No prevertebral fluid or swelling. No visible canal hematoma. Disc levels: Degenerative disc disease with disc height loss at C3-4, C4-5, C5-6, C6-7. broad-based disc osteophyte complex at C3-4 with  bilateral foraminal stenosis and bilateral facet arthropathy. Upper chest: Lung apices are clear. Other: No fluid collection or hematoma. IMPRESSION: 1. No acute intracranial pathology. 2.  No acute osseous injury of the cervical spine. 3. Cervical spine spondylosis as described above. Electronically Signed   By: Kathreen Devoid   On: 09/29/2018 18:39   Ct Chest W Contrast  Result Date: 09/29/2018 CLINICAL DATA:  Trauma. EXAM: CT CHEST WITH CONTRAST TECHNIQUE: Multidetector CT imaging of the chest was performed during intravenous contrast administration. CONTRAST:  52mL OMNIPAQUE IOHEXOL 300 MG/ML  SOLN COMPARISON:  X-ray from same day. FINDINGS: Cardiovascular: The heart is significantly enlarged. There are coronary artery calcifications. There is no large centrally located pulmonary embolus. Detection of smaller segmental and subsegmental pulmonary emboli is severely limited. Aortic calcifications are noted. There is no significant pericardial effusion. Mediastinum/Nodes: No enlarged mediastinal, hilar, or axillary lymph nodes. Thyroid gland, trachea, and esophagus demonstrate no significant findings. Lungs/Pleura: There is no pneumothorax. No large pleural effusion. There are scattered areas of atelectasis and scarring bilaterally. Upper Abdomen: The gallbladder is moderately distended. There is slightly greater than expected density near the pancreatic head, which is only partially visualized. There is a large hiatal hernia. The remaining portions of the pancreas are essentially unremarkable. Musculoskeletal: There are displaced and nondisplaced fractures involving the sixth through ninth ribs on the left. There is no pneumothorax. There is sclerosis of the T2 vertebral body. IMPRESSION: 1. Multiple acute displaced and nondisplaced left-sided rib fractures as above with no evidence of a left-sided pneumothorax. 2. Sclerosis of the T2 vertebral body likely represents an age-indeterminate compression fracture.  Correlation with point tenderness is recommended. 3. Partially visualized density near the pancreatic head, only partially visualized and suboptimally evaluated on this exam. Correlation with an outpatient contrast enhanced CT is recommended for further evaluation of this finding. 4.  Aortic Atherosclerosis (ICD10-I70.0). 5. Large hiatal hernia with the majority of the stomach located within the thoracic cavity. 6. Cardiomegaly. Electronically Signed   By: Constance Holster M.D.   On: 09/29/2018 20:25   Ct Cervical Spine Wo Contrast  Result Date: 09/29/2018 CLINICAL DATA:  Status post fall, leg deformity EXAM: CT HEAD WITHOUT CONTRAST CT CERVICAL SPINE WITHOUT CONTRAST TECHNIQUE: Multidetector CT imaging of the head and cervical spine was performed following the standard protocol without intravenous contrast. Multiplanar CT image reconstructions of the cervical spine were also generated. COMPARISON:  None. FINDINGS: CT HEAD FINDINGS Brain: No evidence of acute infarction, hemorrhage, extra-axial collection, ventriculomegaly, or mass effect. Generalized cerebral atrophy. Periventricular white matter low attenuation likely secondary to microangiopathy. Vascular: Cerebrovascular atherosclerotic calcifications are noted. Skull: Negative for fracture or focal lesion. Prior right frontoparietal craniotomy. Sinuses/Orbits: Visualized portions of the orbits are unremarkable. Visualized portions of the paranasal sinuses and mastoid air cells are unremarkable. Other: None. CT CERVICAL SPINE FINDINGS Alignment: Normal. Skull base and vertebrae: No acute compression fracture. Chronic T1 vertebral body compression fracture. No primary bone  lesion or focal pathologic process. Soft tissues and spinal canal: No prevertebral fluid or swelling. No visible canal hematoma. Disc levels: Degenerative disc disease with disc height loss at C3-4, C4-5, C5-6, C6-7. broad-based disc osteophyte complex at C3-4 with bilateral foraminal  stenosis and bilateral facet arthropathy. Upper chest: Lung apices are clear. Other: No fluid collection or hematoma. IMPRESSION: 1. No acute intracranial pathology. 2.  No acute osseous injury of the cervical spine. 3. Cervical spine spondylosis as described above. Electronically Signed   By: Kathreen Devoid   On: 09/29/2018 18:39   Dg Hip Unilat W Or Wo Pelvis 2-3 Views Left  Result Date: 09/29/2018 CLINICAL DATA:  Pain status post fall EXAM: DG HIP (WITH OR WITHOUT PELVIS) 2-3V LEFT COMPARISON:  None. FINDINGS: There is an acute comminuted intratrochanteric fracture of the proximal left femur. There is no dislocation. There is diffuse osteopenia. Phleboliths project over the patient's pelvis. There are degenerative changes of the right hip. IMPRESSION: Acute comminuted displaced intratrochanteric fracture of the left femur. Electronically Signed   By: Constance Holster M.D.   On: 09/29/2018 19:08    Pending Labs Unresulted Labs (From admission, onward)    Start     Ordered   09/30/18 0500  CBC  Tomorrow morning,   STAT     09/29/18 2332   09/30/18 2876  Basic metabolic panel  Tomorrow morning,   STAT     09/29/18 2332   09/29/18 2328  Type and screen [place]  Once,   STAT    Comments: [place]    09/29/18 2332          Vitals/Pain Today's Vitals   09/29/18 2215 09/29/18 2245 09/29/18 2300 09/29/18 2315  BP: (!) 166/88 128/68 128/63 131/60  Pulse: (!) 57 (!) 122 97 90  Resp: (!) 24 20 (!) 21 17  Temp:      TempSrc:      SpO2: 99% 92% 93% 99%  Weight:      PainSc:        Isolation Precautions No active isolations  Medications Medications  fentaNYL (SUBLIMAZE) injection 25 mcg (25 mcg Intravenous Given 09/29/18 2113)  diltiazem (CARDIZEM) 100 mg in dextrose 5 % 100 mL (1 mg/mL) infusion (5 mg/hr Intravenous New Bag/Given 09/29/18 2136)  diltiazem (CARDIZEM CD) 24 hr capsule 240 mg (has no administration in time range)  lidocaine  (LIDODERM) 5 % 1 patch (1 patch Transdermal Patch Applied 09/29/18 2139)  diltiazem (CARDIZEM CD) 24 hr capsule 240 mg (has no administration in time range)  furosemide (LASIX) tablet 20 mg (has no administration in time range)  levothyroxine (SYNTHROID) tablet 75 mcg (has no administration in time range)  metoprolol succinate (TOPROL-XL) 24 hr tablet 25 mg (has no administration in time range)  potassium chloride SA (K-DUR) CR tablet 10 mEq (has no administration in time range)  ramipril (ALTACE) capsule 5 mg (has no administration in time range)  simvastatin (ZOCOR) tablet 20 mg (has no administration in time range)  donepezil (ARICEPT) tablet 10 mg (has no administration in time range)  escitalopram (LEXAPRO) tablet 10 mg (has no administration in time range)  memantine (NAMENDA) tablet 10 mg (has no administration in time range)  pantoprazole (PROTONIX) EC tablet 40 mg (has no administration in time range)  vitamin C (ASCORBIC ACID) tablet 1,000 mg (has no administration in time range)  HYDROcodone-acetaminophen (NORCO/VICODIN) 5-325 MG per tablet 1-2 tablet (has no administration in time range)  morphine 2  MG/ML injection 0.5 mg (has no administration in time range)  polyethylene glycol (MIRALAX / GLYCOLAX) packet 17 g (has no administration in time range)  cefTRIAXone (ROCEPHIN) 1 g in sodium chloride 0.9 % 100 mL IVPB (has no administration in time range)  fentaNYL (SUBLIMAZE) injection 50 mcg (50 mcg Intravenous Given 09/29/18 1846)  ondansetron (ZOFRAN) injection 4 mg (4 mg Intravenous Given 09/29/18 1845)  iohexol (OMNIPAQUE) 300 MG/ML solution 60 mL (60 mLs Intravenous Contrast Given 09/29/18 1950)  cefTRIAXone (ROCEPHIN) 1 g in sodium chloride 0.9 % 100 mL IVPB (0 g Intravenous Stopped 09/29/18 2138)  diltiazem (CARDIZEM) injection 5 mg (5 mg Intravenous Given 09/29/18 2057)  ceFAZolin (ANCEF) IVPB 1 g/50 mL premix (0 g Intravenous Stopped 09/29/18 2307)    Mobility walks with  device High fall risk   Focused Assessments n/a   R Recommendations: See Admitting Provider Note  Report given to:   Additional Notes:  Pleasantly demented, was living at home alone till the fall, will need nursing home after discharge, pt is not aware of that

## 2018-09-29 NOTE — ED Notes (Signed)
Family at bedside. 

## 2018-09-29 NOTE — ED Notes (Signed)
This EDT and EDT Holli Humbles put a sugar tong splint on the left arm.

## 2018-09-29 NOTE — Progress Notes (Signed)
Full consult note and to follow tomorrow.   Called by ED staff. Imaging reviewed.  - Plan for surgery tomorrow, likely late morning/early afternoon. Will plan for L hip cephalomedullary nailing, closed reduction L distal radius.  - NPO after midnight - Hold anticoagulation, last dose of Eliquis was 09/28/18 @ 5pm.  - Admit to Hospitalist team. Please medically optimize in advance of surgery tomorrow AM.  - I have discussed findings and plan with the patient's son who is in agreement.

## 2018-09-29 NOTE — ED Notes (Signed)
Patient transported to CT 

## 2018-09-29 NOTE — ED Provider Notes (Signed)
[facility] Emergency Department Provider Note  ____________________________________________  Time seen: Approximately 5:49 PM  I have reviewed the triage vital signs and the nursing notes.   HISTORY  Chief Complaint Fall and Hip Pain  Level 5 caveat: Patient with baseline dementia.  Unable to answer questions regarding history surrounding event.  Patient is able to answer questions about current symptoms..  Majority of history is provided by EMS.  Patient's family members in route to emergency department.  HPI Holly Burns is a 85 y.o. female who  presents emergency department via EMS for an unwitnessed fall.  Unknown downtime.  According to EMS, the patient has a baseline history of dementia and is unable to answer how she fell or how long she had been on the floor.  According to EMS, patient has daily routines and medications that were performed each day.  It appears that patient has taken her morning medications and on her morning routine.  It is estimated that she was in the floor anywhere from 1 to 5 hours.  Patient is noncontributory to her history.  Patient is currently endorsing left wrist and left hip pain.  She denies any other complaints at this time.  She denies any headache, neck pain, chest pain, shortness of breath, abdominal pain.  Patient was given 50 mics of fentanyl intranasally in route.  EMS reports shortening and rotation of the left lower extremity.  Pulses and sensation reportedly intact from EMS.  Patient is repeatedly saying that "I want to go ahead and die."        Past Medical History:  Diagnosis Date  . CHF (congestive heart failure) ([place])   . Depression   . GERD (gastroesophageal reflux disease)   . Hyperlipidemia   . Hypertension   . Thyroid disease     Patient Active Problem List   Diagnosis Date Noted  . Atrial fibrillation with RVR ([place]) 03/30/2015  . Adult idiopathic generalized osteoporosis 03/12/2015  . Anxiety  10/13/2014  . B12 deficiency 10/13/2014  . Breast CA ([place]) 10/13/2014  . Atherosclerosis of coronary artery 10/13/2014  . Chronic kidney disease (CKD), stage III (moderate) ([place]) 10/13/2014  . CCF (congestive cardiac failure) ([place]) 10/13/2014  . Clinical depression 10/13/2014  . DD (diverticular disease) 10/13/2014  . Bleeding gastrointestinal 10/13/2014  . Acid reflux 10/13/2014  . 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 Hypercholesteremia 10/13/2014  . BP (high blood pressure) 10/13/2014  . Decreased potassium in the blood 10/13/2014  . Adult hypothyroidism 10/13/2014  . Hypoxia 10/13/2014  . OP (osteoporosis) 10/13/2014  . Dementia ([place]) 10/13/2014  . Avitaminosis D 10/13/2014  . Involutional osteoporosis 01/02/2014  . Heart attack ([place]) 09/12/2013  . H/O cardiac catheterization 09/12/2013  . Brain tumor ([place]) 09/12/2013    Past Surgical History:  Procedure Laterality Date  . ABDOMINAL HYSTERECTOMY  1980  . ANGIOPLASTY     due to MI LV ef-40%  . cataract surgery Left   . CRANIOTOMY    . MASTECTOMY Left 02/26/1995   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, no chemo or radiation     Prior to Admission medications   Medication Sig Start Date End Date Taking? Authorizing Provider  acetaminophen (TYLENOL) 500 MG tablet Take 500-1,000 mg by mouth every 6 (six) hours as needed.    Yes [provider]  diltiazem (CARDIZEM CD) 240 MG 24 hr capsule Take 1 capsule (240 mg total) by mouth daily as needed. 04/13/15  Yes Margarita Rana, MD  donepezil (  ARICEPT) 10 MG tablet Take 10 mg by mouth at bedtime.   Yes [provider]  ELIQUIS 2.5 MG TABS tablet TAKE (1) TABLET TWICE A DAY. 05/23/15  Yes Margarita Rana, MD  escitalopram (LEXAPRO) 10 MG tablet Take 10 mg by mouth 2 (two) times a day.    Yes [provider]  furosemide (LASIX) 20 MG tablet Take 1 tablet (20 mg total) by mouth 2 (two) times daily. 01/29/15  Yes Margarita Rana, MD  levothyroxine (SYNTHROID)  75 MCG tablet Take 1 tablet (75 mcg total) by mouth daily before breakfast. 12/05/14  Yes Margarita Rana, MD  LORazepam (ATIVAN) 1 MG tablet Take 1 tablet (1 mg total) by mouth every 4 (four) hours as needed for anxiety. Patient taking differently: Take 0.5-1 mg by mouth every 4 (four) hours as needed for anxiety.  03/03/15  Yes Margarita Rana, MD  memantine (NAMENDA) 10 MG tablet Take 10 mg by mouth 2 (two) times daily.  01/29/11  Yes [provider]  metoprolol succinate (TOPROL-XL) 25 MG 24 hr tablet Take 1 tablet (25 mg total) by mouth daily. 03/03/15  Yes Margarita Rana, MD  omeprazole (PRILOSEC) 20 MG capsule Take 20 mg by mouth daily.    Yes [provider]  OXYGEN 2 L.   Yes [provider]  potassium chloride (MICRO-K) 10 MEQ CR capsule Take 1 capsule (10 mEq total) by mouth daily. 11/07/14  Yes Margarita Rana, MD  ramipril (ALTACE) 5 MG capsule Take 1 capsule (5 mg total) by mouth daily. 04/02/15  Yes Henreitta Leber, MD  simvastatin (ZOCOR) 20 MG tablet Take 1 tablet (20 mg total) by mouth at bedtime. 01/10/15  Yes Margarita Rana, MD  vitamin C (ASCORBIC ACID) 500 MG tablet Take 1,000 mg by mouth daily.    Yes [provider]    Allergies Codeine, Penicillins, and Phenytoin sodium extended  Family History  Problem Relation Age of Onset  . Hypertension Mother   . Cerebrovascular Accident Mother   . Heart disease Father     Social History Social History   Tobacco Use  . Smoking status: Never Smoker  . Smokeless tobacco: Never Used  Substance Use Topics  . Alcohol use: No  . Drug use: No     Review of Systems  Constitutional: No fever/chills.  Positive for fall Eyes: No visual changes.  ENT: No upper respiratory complaints. Cardiovascular: no chest pain. Respiratory: no reported cough. No reported SOB. Gastrointestinal: No abdominal pain.   Genitourinary: Unsure regarding dysuria, polyuria, hematuria. Musculoskeletal: Positive for  left hip and left wrist pain Skin: Negative for rash, abrasions, lacerations, ecchymosis. Neurological: Negative for headaches 10-point ROS otherwise negative.  ____________________________________________   PHYSICAL EXAM:  VITAL SIGNS: ED Triage Vitals [09/29/18 1748]  Enc Vitals Group     BP      Pulse      Resp      Temp      Temp src      SpO2      Weight 121 lb 4.1 oz (55 kg)     Height      Head Circumference      Peak Flow      Pain Score      Pain Loc      Pain Edu?      Excl. in [place]?      Constitutional: Alert and oriented to person and place.  Unsure of date/time.. Well appearing and in mild distress from pain.. Eyes:  Conjunctivae are normal. PERRL. EOMI. Head: Atraumatic.  No visible signs of trauma on exam.  Nontender to palpation over the osseous structures of the skull and face.  No palpable abnormalities.  No battle signs or raccoon eyes.  No serosanguineous fluid drainage from the ears or nares. ENT:      Ears:       Nose: No congestion/rhinnorhea.      Mouth/Throat: Mucous membranes are moist.  Neck: No stridor.  No cervical spine tenderness to palpation  Cardiovascular: Normal rate, regular rhythm. Normal S1 and S2.  Good peripheral circulation. Respiratory: Normal respiratory effort without tachypnea or retractions. Lungs CTAB. Good air entry to the bases with no decreased or absent breath sounds. Gastrointestinal: Bowel sounds 4 quadrants. Soft and nontender to palpation. No guarding or rigidity. No palpable masses. No distention.  Musculoskeletal:  Visualization of the left wrist reveals edema, ecchymosis.  Palpation reveals tenderness with palpable abnormality over the distal radius.  No other palpable findings to the left upper extremity.  Radial pulse intact.  Patient endorses sensation intact in all 5 digits.  Visualization of the left lower extremity reveals shortening and external rotation.  Edema to the lateral aspect of the hip is appreciated.  No  gross visible deformity.  No ecchymosis of the left thigh region.  Patient is exquisitely tender to palpation along the inguinal region as well as the lateral hip.  No range of motion is performed at this time.  Patient is nontender to palpation over the osseous structures of the knee, tibia and fibula, ankle, foot.  Dorsalis pedis pulse intact distally.  Sensation intact distally. Neurologic:  Normal speech and language. No gross focal neurologic deficits are appreciated.  Skin:  Skin is warm, dry and intact. No rash noted. Psychiatric: Mood and affect are normal. Speech and behavior are normal.  Patient with a history of dementia.  Unable to answer questions regarding history of fall.  She is able to answer symptoms currently.  Patient is repeatedly stating that, "it is my time, I am ready to die."  She is also reporting, "it is time to go home and meet God."  Patient denies any suicide attempt or ideations, she states "it is my time, I am ready to go."   ____________________________________________   LABS (all labs ordered are listed, but only abnormal results are displayed)  Labs Reviewed  CBC WITH DIFFERENTIAL/PLATELET - Abnormal; Notable for the following components:      Result Value   WBC 21.1 (*)    RBC 3.61 (*)    Hemoglobin 11.3 (*)    HCT 34.6 (*)    Neutro Abs 18.9 (*)    Lymphs Abs 0.6 (*)    Monocytes Absolute 1.3 (*)    Abs Immature Granulocytes 0.11 (*)    All other components within normal limits  URINALYSIS, COMPLETE (UACMP) WITH MICROSCOPIC - Abnormal; Notable for the following components:   Color, Urine YELLOW (*)    APPearance CLOUDY (*)    Ketones, ur 20 (*)    Leukocytes,Ua TRACE (*)    Bacteria, UA MANY (*)    All other components within normal limits  COMPREHENSIVE METABOLIC PANEL - Abnormal; Notable for the following components:   CO2 21 (*)    Glucose, Bld 280 (*)    BUN 30 (*)    Creatinine, Ser 1.33 (*)    Total Bilirubin 1.7 (*)    GFR calc non Af  Amer 35 (*)    GFR calc Af  Amer 40 (*)    Anion gap 17 (*)    All other components within normal limits  SARS CORONAVIRUS 2 (HOSPITAL ORDER, [place] [facility])  TROPONIN I  PROTIME-INR  APTT  TYPE AND SCREEN  TYPE AND SCREEN  TYPE AND SCREEN   ____________________________________________  EKG   ____________________________________________  RADIOLOGY I personally viewed and evaluated these images as part of my medical decision making, as well as reviewing the written report by the radiologist.  Dg Chest 1 View  Result Date: 09/29/2018 CLINICAL DATA:  Pain status post fall. EXAM: CHEST  1 VIEW COMPARISON:  03/29/2015 FINDINGS: Heart size is enlarged. There are aortic calcifications. Again noted are findings suspicious for a hiatal hernia. There is no acute osseous abnormality. Surgical clips are noted along the patient's left axilla. No large pleural effusion. No focal infiltrate. There are minimally displaced fractures involving the 6 through eighth ribs on the left. There is a deep sulcus sign on the left. IMPRESSION: 1. Acute minimally displaced fractures involving the sixth through eighth ribs on the left. There is a deep sulcus on the left concerning for left-sided pneumothorax. Consider upright radiograph for further evaluation of this finding or a follow-up CT. 2. Mild cardiomegaly with aortic calcifications. These results were called by telephone at the time of interpretation on 09/29/2018 at 7:04 pm to [facility] , who verbally acknowledged these results. Electronically Signed   By: Constance Holster M.D.   On: 09/29/2018 19:07   Dg Wrist Complete Left  Result Date: 09/29/2018 CLINICAL DATA:  Pain status post fall EXAM: LEFT WRIST - COMPLETE 3+ VIEW COMPARISON:  None. FINDINGS: There is an acute impacted intra-articular fracture of the distal radius. There is some mild dorsal angulation of the distal fracture fragment. There is an acute minimally  displaced fracture of the ulnar styloid process. There are advanced degenerative changes of the first carpometacarpal joint. There is osteopenia. IMPRESSION: 1. Acute impacted intra-articular fracture of the distal radius. 2. Acute minimally displaced fracture of the ulnar styloid process. 3. Osteopenia Electronically Signed   By: Constance Holster M.D.   On: 09/29/2018 19:09   Ct Head Wo Contrast  Result Date: 09/29/2018 CLINICAL DATA:  Status post fall, leg deformity EXAM: CT HEAD WITHOUT CONTRAST CT CERVICAL SPINE WITHOUT CONTRAST TECHNIQUE: Multidetector CT imaging of the head and cervical spine was performed following the standard protocol without intravenous contrast. Multiplanar CT image reconstructions of the cervical spine were also generated. COMPARISON:  None. FINDINGS: CT HEAD FINDINGS Brain: No evidence of acute infarction, hemorrhage, extra-axial collection, ventriculomegaly, or mass effect. Generalized cerebral atrophy. Periventricular white matter low attenuation likely secondary to microangiopathy. Vascular: Cerebrovascular atherosclerotic calcifications are noted. Skull: Negative for fracture or focal lesion. Prior right frontoparietal craniotomy. Sinuses/Orbits: Visualized portions of the orbits are unremarkable. Visualized portions of the paranasal sinuses and mastoid air cells are unremarkable. Other: None. CT CERVICAL SPINE FINDINGS Alignment: Normal. Skull base and vertebrae: No acute compression fracture. Chronic T1 vertebral body compression fracture. No primary bone lesion or focal pathologic process. Soft tissues and spinal canal: No prevertebral fluid or swelling. No visible canal hematoma. Disc levels: Degenerative disc disease with disc height loss at C3-4, C4-5, C5-6, C6-7. broad-based disc osteophyte complex at C3-4 with bilateral foraminal stenosis and bilateral facet arthropathy. Upper chest: Lung apices are clear. Other: No fluid collection or hematoma. IMPRESSION: 1. No acute  intracranial pathology. 2.  No acute osseous injury of the cervical spine. 3. Cervical spine spondylosis  as described above. Electronically Signed   By: Kathreen Devoid   On: 09/29/2018 18:39   Ct Chest W Contrast  Result Date: 09/29/2018 CLINICAL DATA:  Trauma. EXAM: CT CHEST WITH CONTRAST TECHNIQUE: Multidetector CT imaging of the chest was performed during intravenous contrast administration. CONTRAST:  72mL OMNIPAQUE IOHEXOL 300 MG/ML  SOLN COMPARISON:  X-ray from same day. FINDINGS: Cardiovascular: The heart is significantly enlarged. There are coronary artery calcifications. There is no large centrally located pulmonary embolus. Detection of smaller segmental and subsegmental pulmonary emboli is severely limited. Aortic calcifications are noted. There is no significant pericardial effusion. Mediastinum/Nodes: No enlarged mediastinal, hilar, or axillary lymph nodes. Thyroid gland, trachea, and esophagus demonstrate no significant findings. Lungs/Pleura: There is no pneumothorax. No large pleural effusion. There are scattered areas of atelectasis and scarring bilaterally. Upper Abdomen: The gallbladder is moderately distended. There is slightly greater than expected density near the pancreatic head, which is only partially visualized. There is a large hiatal hernia. The remaining portions of the pancreas are essentially unremarkable. Musculoskeletal: There are displaced and nondisplaced fractures involving the sixth through ninth ribs on the left. There is no pneumothorax. There is sclerosis of the T2 vertebral body. IMPRESSION: 1. Multiple acute displaced and nondisplaced left-sided rib fractures as above with no evidence of a left-sided pneumothorax. 2. Sclerosis of the T2 vertebral body likely represents an age-indeterminate compression fracture. Correlation with point tenderness is recommended. 3. Partially visualized density near the pancreatic head, only partially visualized and suboptimally evaluated on  this exam. Correlation with an outpatient contrast enhanced CT is recommended for further evaluation of this finding. 4.  Aortic Atherosclerosis (ICD10-I70.0). 5. Large hiatal hernia with the majority of the stomach located within the thoracic cavity. 6. Cardiomegaly. Electronically Signed   By: Constance Holster M.D.   On: 09/29/2018 20:25   Ct Cervical Spine Wo Contrast  Result Date: 09/29/2018 CLINICAL DATA:  Status post fall, leg deformity EXAM: CT HEAD WITHOUT CONTRAST CT CERVICAL SPINE WITHOUT CONTRAST TECHNIQUE: Multidetector CT imaging of the head and cervical spine was performed following the standard protocol without intravenous contrast. Multiplanar CT image reconstructions of the cervical spine were also generated. COMPARISON:  None. FINDINGS: CT HEAD FINDINGS Brain: No evidence of acute infarction, hemorrhage, extra-axial collection, ventriculomegaly, or mass effect. Generalized cerebral atrophy. Periventricular white matter low attenuation likely secondary to microangiopathy. Vascular: Cerebrovascular atherosclerotic calcifications are noted. Skull: Negative for fracture or focal lesion. Prior right frontoparietal craniotomy. Sinuses/Orbits: Visualized portions of the orbits are unremarkable. Visualized portions of the paranasal sinuses and mastoid air cells are unremarkable. Other: None. CT CERVICAL SPINE FINDINGS Alignment: Normal. Skull base and vertebrae: No acute compression fracture. Chronic T1 vertebral body compression fracture. No primary bone lesion or focal pathologic process. Soft tissues and spinal canal: No prevertebral fluid or swelling. No visible canal hematoma. Disc levels: Degenerative disc disease with disc height loss at C3-4, C4-5, C5-6, C6-7. broad-based disc osteophyte complex at C3-4 with bilateral foraminal stenosis and bilateral facet arthropathy. Upper chest: Lung apices are clear. Other: No fluid collection or hematoma. IMPRESSION: 1. No acute intracranial pathology.  2.  No acute osseous injury of the cervical spine. 3. Cervical spine spondylosis as described above. Electronically Signed   By: Kathreen Devoid   On: 09/29/2018 18:39   Dg Hip Unilat W Or Wo Pelvis 2-3 Views Left  Result Date: 09/29/2018 CLINICAL DATA:  Pain status post fall EXAM: DG HIP (WITH OR WITHOUT PELVIS) 2-3V LEFT COMPARISON:  None. FINDINGS: There is  an acute comminuted intratrochanteric fracture of the proximal left femur. There is no dislocation. There is diffuse osteopenia. Phleboliths project over the patient's pelvis. There are degenerative changes of the right hip. IMPRESSION: Acute comminuted displaced intratrochanteric fracture of the left femur. Electronically Signed   By: Constance Holster M.D.   On: 09/29/2018 19:08    ____________________________________________    PROCEDURES  Procedure(s) performed:    Procedures    Medications  fentaNYL (SUBLIMAZE) injection 25 mcg (25 mcg Intravenous Given 09/29/18 2113)  diltiazem (CARDIZEM) 100 mg in dextrose 5 % 100 mL (1 mg/mL) infusion (5 mg/hr Intravenous New Bag/Given 09/29/18 2136)  diltiazem (CARDIZEM CD) 24 hr capsule 240 mg (has no administration in time range)  ceFAZolin (ANCEF) IVPB 1 g/50 mL premix (has no administration in time range)  lidocaine (LIDODERM) 5 % 1 patch (1 patch Transdermal Patch Applied 09/29/18 2139)  fentaNYL (SUBLIMAZE) injection 50 mcg (50 mcg Intravenous Given 09/29/18 1846)  ondansetron (ZOFRAN) injection 4 mg (4 mg Intravenous Given 09/29/18 1845)  iohexol (OMNIPAQUE) 300 MG/ML solution 60 mL (60 mLs Intravenous Contrast Given 09/29/18 1950)  cefTRIAXone (ROCEPHIN) 1 g in sodium chloride 0.9 % 100 mL IVPB (0 g Intravenous Stopped 09/29/18 2138)  diltiazem (CARDIZEM) injection 5 mg (5 mg Intravenous Given 09/29/18 2057)     ____________________________________________   INITIAL IMPRESSION / ASSESSMENT AND PLAN / ED COURSE  Pertinent labs & imaging results that were available during my care of  the patient were reviewed by me and considered in my medical decision making (see chart for details).  Review of the [place] CSRS was performed in accordance of the [place] prior to dispensing any controlled drugs.           Patient's diagnosis is consistent with fall resulting in closed displaced intertrochanteric fracture of the left femur, distal radius fracture.  Patient presented to emergency department after a unwitnessed fall at home.  Unsure length of time on the floor.  Patient has a history of dementia and is unable to provide historical details.  She is able to answer questions about symptoms currently.  Patient has findings consistent with left hip fracture, left wrist fracture, multiple fractures of the left ribs..  There was concern on initial chest x-ray that patient may have suffered a pneumothorax as well and it was recommended to have a CT scan for further evaluation.  Given multiple traumatic findings, patient was moved from the fast track side emergency department to the major section of the emergency department.  Patient care was transferred to Dr. Quentin Cornwall, attending provider in the emergency department for final diagnosis and disposition.    ____________________________________________  FINAL CLINICAL IMPRESSION(S) / ED DIAGNOSES  Final diagnoses:  Closed displaced intertrochanteric fracture of left femur, initial encounter ([place])  Closed fracture of distal end of left radius, unspecified fracture morphology, initial encounter  Atrial fibrillation with rapid ventricular response ([place])      NEW MEDICATIONS STARTED DURING THIS VISIT:  ED Discharge Orders    None          This chart was dictated using voice recognition software/Dragon. Despite best efforts to proofread, errors can occur which can change the meaning. Any change was purely unintentional.    Darletta Moll, PA-C 09/29/18 2205    Merlyn Lot, MD 09/29/18 2211

## 2018-09-29 NOTE — ED Triage Notes (Signed)
Pt to ED after a fall at home. Pt was left on the ground for multiple hours. Pt presents to ED with obvious hip deformity to left hip. Leg is shortened and rotated. Pedal pulse intact and color appropriate. Swelling, bruising and deformity to left arm as well.

## 2018-09-30 ENCOUNTER — Encounter
Admission: EM | Disposition: A | Discharge status: Skilled Nursing Facility | Payer: Self-pay | Source: Home / Self Care | Attending: Internal Medicine

## 2018-09-30 ENCOUNTER — Inpatient Hospital Stay: Payer: Medicare (Managed Care)

## 2018-09-30 ENCOUNTER — Inpatient Hospital Stay: Payer: Medicare (Managed Care) | Admitting: Certified Registered Nurse Anesthetist

## 2018-09-30 ENCOUNTER — Encounter: Payer: Self-pay | Admitting: Certified Registered Nurse Anesthetist

## 2018-09-30 ENCOUNTER — Inpatient Hospital Stay
Admit: 2018-09-30 | Discharge: 2018-09-30 | Disposition: A | Discharge status: Home / Self Care | Payer: Medicare (Managed Care) | Attending: Nurse Practitioner | Admitting: Nurse Practitioner

## 2018-09-30 HISTORY — PX: INTRAMEDULLARY (IM) NAIL INTERTROCHANTERIC: SHX5875

## 2018-09-30 HISTORY — PX: CLOSED REDUCTION WRIST FRACTURE: SHX1091

## 2018-09-30 LAB — ECHOCARDIOGRAM COMPLETE
Height: 62 in
Weight: 1940.05 oz

## 2018-09-30 LAB — CBC
HCT: 31.1 % — ABNORMAL LOW (ref 36.0–46.0)
Hemoglobin: 10.2 g/dL — ABNORMAL LOW (ref 12.0–15.0)
MCH: 31.4 pg (ref 26.0–34.0)
MCHC: 32.8 g/dL (ref 30.0–36.0)
MCV: 95.7 fL (ref 80.0–100.0)
Platelets: 281 10*3/uL (ref 150–400)
RBC: 3.25 MIL/uL — ABNORMAL LOW (ref 3.87–5.11)
RDW: 15 % (ref 11.5–15.5)
WBC: 21 10*3/uL — ABNORMAL HIGH (ref 4.0–10.5)
nRBC: 0 % (ref 0.0–0.2)

## 2018-09-30 LAB — BASIC METABOLIC PANEL
Anion gap: 14 (ref 5–15)
BUN: 37 mg/dL — ABNORMAL HIGH (ref 8–23)
CO2: 25 mmol/L (ref 22–32)
Calcium: 8.8 mg/dL — ABNORMAL LOW (ref 8.9–10.3)
Chloride: 105 mmol/L (ref 98–111)
Creatinine, Ser: 1.77 mg/dL — ABNORMAL HIGH (ref 0.44–1.00)
GFR calc Af Amer: 29 mL/min — ABNORMAL LOW (ref >60)
GFR calc non Af Amer: 25 mL/min — ABNORMAL LOW (ref >60)
Glucose, Bld: 179 mg/dL — ABNORMAL HIGH (ref 70–99)
Potassium: 4.3 mmol/L (ref 3.5–5.1)
Sodium: 144 mmol/L (ref 135–145)

## 2018-09-30 LAB — SURGICAL PCR SCREEN
MRSA, PCR: NEGATIVE
Staphylococcus aureus: NEGATIVE

## 2018-09-30 SURGERY — FIXATION, FRACTURE, INTERTROCHANTERIC, WITH INTRAMEDULLARY ROD
Anesthesia: General | Site: Wrist | Laterality: Left

## 2018-09-30 MED ORDER — ROCURONIUM BROMIDE 100 MG/10ML IV SOLN
INTRAVENOUS | Status: DC | PRN
  Administered 2018-09-30: 30 mg via INTRAVENOUS

## 2018-09-30 MED ORDER — ACETAMINOPHEN 500 MG PO TABS
1000.0000 mg | ORAL_TABLET | Freq: Three times a day (TID) | ORAL | Status: AC
  Administered 2018-09-30 – 2018-10-01 (×4): 1000 mg via ORAL
  Filled 2018-09-30 (×4): qty 2

## 2018-09-30 MED ORDER — SODIUM CHLORIDE 0.9 % IV SOLN
INTRAVENOUS | Status: DC
  Administered 2018-09-30: 20:00:00 via INTRAVENOUS

## 2018-09-30 MED ORDER — ENSURE ENLIVE PO LIQD
237.0000 mL | Freq: Two times a day (BID) | ORAL | Status: DC
  Administered 2018-10-01 – 2018-10-03 (×5): 237 mL via ORAL

## 2018-09-30 MED ORDER — DEXMEDETOMIDINE HCL IN NACL 200 MCG/50ML IV SOLN
INTRAVENOUS | Status: AC
  Filled 2018-09-30: qty 50

## 2018-09-30 MED ORDER — LACTATED RINGERS IV SOLN
INTRAVENOUS | Status: DC | PRN
  Administered 2018-09-30: 16:00:00 via INTRAVENOUS

## 2018-09-30 MED ORDER — SUGAMMADEX SODIUM 200 MG/2ML IV SOLN
INTRAVENOUS | Status: DC | PRN
  Administered 2018-09-30: 110 mg via INTRAVENOUS

## 2018-09-30 MED ORDER — DEXAMETHASONE SODIUM PHOSPHATE 10 MG/ML IJ SOLN
INTRAMUSCULAR | Status: DC | PRN
  Administered 2018-09-30: 10 mg via INTRAVENOUS

## 2018-09-30 MED ORDER — METOPROLOL SUCCINATE ER 50 MG PO TB24
50.0000 mg | ORAL_TABLET | Freq: Every day | ORAL | Status: DC
  Administered 2018-09-30 – 2018-10-04 (×5): 50 mg via ORAL
  Filled 2018-09-30 (×5): qty 1

## 2018-09-30 MED ORDER — PHENYLEPHRINE HCL (PRESSORS) 10 MG/ML IV SOLN
INTRAVENOUS | Status: DC | PRN
  Administered 2018-09-30 (×5): 100 ug via INTRAVENOUS

## 2018-09-30 MED ORDER — SODIUM CHLORIDE 0.9 % IV SOLN
INTRAVENOUS | Status: DC | PRN
  Administered 2018-09-30: 30 ug/min via INTRAVENOUS

## 2018-09-30 MED ORDER — FENTANYL CITRATE (PF) 100 MCG/2ML IJ SOLN
INTRAMUSCULAR | Status: DC | PRN
  Administered 2018-09-30: 25 ug via INTRAVENOUS

## 2018-09-30 MED ORDER — OXYCODONE HCL 5 MG PO TABS
5.0000 mg | ORAL_TABLET | ORAL | Status: DC | PRN
  Administered 2018-10-01: 01:00:00 5 mg via ORAL
  Filled 2018-09-30: qty 1

## 2018-09-30 MED ORDER — DILTIAZEM HCL ER COATED BEADS 120 MG PO CP24
240.0000 mg | ORAL_CAPSULE | Freq: Every day | ORAL | Status: DC
  Administered 2018-09-30 – 2018-10-04 (×5): 240 mg via ORAL
  Filled 2018-09-30 (×4): qty 2

## 2018-09-30 MED ORDER — FENTANYL CITRATE (PF) 100 MCG/2ML IJ SOLN
INTRAMUSCULAR | Status: AC
  Filled 2018-09-30: qty 2

## 2018-09-30 MED ORDER — FENTANYL CITRATE (PF) 100 MCG/2ML IJ SOLN
25.0000 ug | INTRAMUSCULAR | Status: DC | PRN
  Administered 2018-09-30 (×2): 25 ug via INTRAVENOUS

## 2018-09-30 MED ORDER — ONDANSETRON HCL 4 MG/2ML IJ SOLN
4.0000 mg | Freq: Four times a day (QID) | INTRAMUSCULAR | Status: DC | PRN
  Administered 2018-10-01: 4 mg via INTRAVENOUS
  Filled 2018-09-30: qty 2

## 2018-09-30 MED ORDER — METOCLOPRAMIDE HCL 5 MG/ML IJ SOLN: 5.0000 mg | Freq: Three times a day (TID) | INTRAMUSCULAR | Status: DC | PRN

## 2018-09-30 MED ORDER — EPHEDRINE SULFATE 50 MG/ML IJ SOLN
INTRAMUSCULAR | Status: DC | PRN
  Administered 2018-09-30: 5 mg via INTRAVENOUS
  Administered 2018-09-30: 10 mg via INTRAVENOUS
  Administered 2018-09-30: 5 mg via INTRAVENOUS
  Administered 2018-09-30: 10 mg via INTRAVENOUS

## 2018-09-30 MED ORDER — ONDANSETRON HCL 4 MG PO TABS: 4.0000 mg | ORAL_TABLET | Freq: Four times a day (QID) | ORAL | Status: DC | PRN

## 2018-09-30 MED ORDER — PROPOFOL 10 MG/ML IV BOLUS
INTRAVENOUS | Status: AC
  Filled 2018-09-30: qty 20

## 2018-09-30 MED ORDER — ROCURONIUM BROMIDE 50 MG/5ML IV SOLN
INTRAVENOUS | Status: AC
  Filled 2018-09-30: qty 1

## 2018-09-30 MED ORDER — METHOCARBAMOL 500 MG PO TABS
500.0000 mg | ORAL_TABLET | Freq: Four times a day (QID) | ORAL | Status: DC | PRN
  Administered 2018-10-01 – 2018-10-04 (×3): 500 mg via ORAL
  Filled 2018-09-30 (×4): qty 1

## 2018-09-30 MED ORDER — ACETAMINOPHEN 325 MG PO TABS
325.0000 mg | ORAL_TABLET | Freq: Four times a day (QID) | ORAL | Status: DC | PRN
  Administered 2018-10-03 – 2018-10-04 (×3): 650 mg via ORAL
  Filled 2018-09-30 (×3): qty 2

## 2018-09-30 MED ORDER — PROPOFOL 10 MG/ML IV BOLUS
INTRAVENOUS | Status: DC | PRN
  Administered 2018-09-30: 60 mg via INTRAVENOUS
  Administered 2018-09-30: 20 mg via INTRAVENOUS

## 2018-09-30 MED ORDER — METHOCARBAMOL 1000 MG/10ML IJ SOLN
500.0000 mg | Freq: Four times a day (QID) | INTRAVENOUS | Status: DC | PRN
  Filled 2018-09-30: qty 5

## 2018-09-30 MED ORDER — BISACODYL 10 MG RE SUPP: 10.0000 mg | Freq: Every day | RECTAL | Status: DC | PRN

## 2018-09-30 MED ORDER — CEFAZOLIN SODIUM-DEXTROSE 1-4 GM/50ML-% IV SOLN
INTRAVENOUS | Status: DC | PRN
  Administered 2018-09-30: 1 g via INTRAVENOUS

## 2018-09-30 MED ORDER — SENNOSIDES-DOCUSATE SODIUM 8.6-50 MG PO TABS
1.0000 | ORAL_TABLET | Freq: Every evening | ORAL | Status: DC | PRN
  Administered 2018-10-03: 1 via ORAL
  Filled 2018-09-30: qty 1

## 2018-09-30 MED ORDER — LIDOCAINE HCL (PF) 2 % IJ SOLN
INTRAMUSCULAR | Status: AC
  Filled 2018-09-30: qty 10

## 2018-09-30 MED ORDER — CEFAZOLIN SODIUM-DEXTROSE 1-4 GM/50ML-% IV SOLN
INTRAVENOUS | Status: AC
  Filled 2018-09-30: qty 50

## 2018-09-30 MED ORDER — HYDROMORPHONE HCL 1 MG/ML IJ SOLN
0.2500 mg | INTRAMUSCULAR | Status: DC | PRN
  Administered 2018-10-01: 0.5 mg via INTRAVENOUS
  Filled 2018-09-30: qty 1

## 2018-09-30 MED ORDER — DILTIAZEM HCL 60 MG PO TABS
60.0000 mg | ORAL_TABLET | Freq: Four times a day (QID) | ORAL | Status: DC
  Filled 2018-09-30: qty 1

## 2018-09-30 MED ORDER — FLEET ENEMA 7-19 GM/118ML RE ENEM: 1.0000 | ENEMA | Freq: Once | RECTAL | Status: DC | PRN

## 2018-09-30 MED ORDER — DOCUSATE SODIUM 100 MG PO CAPS
100.0000 mg | ORAL_CAPSULE | Freq: Two times a day (BID) | ORAL | Status: DC
  Administered 2018-09-30 – 2018-10-04 (×8): 100 mg via ORAL
  Filled 2018-09-30 (×8): qty 1

## 2018-09-30 MED ORDER — ONDANSETRON HCL 4 MG/2ML IJ SOLN
INTRAMUSCULAR | Status: DC | PRN
  Administered 2018-09-30: 4 mg via INTRAVENOUS

## 2018-09-30 MED ORDER — APIXABAN 2.5 MG PO TABS
2.5000 mg | ORAL_TABLET | Freq: Two times a day (BID) | ORAL | Status: DC
  Administered 2018-10-01 – 2018-10-04 (×7): 2.5 mg via ORAL
  Filled 2018-09-30 (×7): qty 1

## 2018-09-30 MED ORDER — LIDOCAINE HCL (CARDIAC) PF 100 MG/5ML IV SOSY
PREFILLED_SYRINGE | INTRAVENOUS | Status: DC | PRN
  Administered 2018-09-30: 40 mg via INTRAVENOUS

## 2018-09-30 MED ORDER — CEFAZOLIN SODIUM-DEXTROSE 1-4 GM/50ML-% IV SOLN
1.0000 g | Freq: Four times a day (QID) | INTRAVENOUS | Status: AC
  Administered 2018-09-30 – 2018-10-01 (×3): 1 g via INTRAVENOUS
  Filled 2018-09-30 (×3): qty 50

## 2018-09-30 MED ORDER — METOCLOPRAMIDE HCL 10 MG PO TABS: 5.0000 mg | ORAL_TABLET | Freq: Three times a day (TID) | ORAL | Status: DC | PRN

## 2018-09-30 MED ORDER — SIMVASTATIN 20 MG PO TABS
10.0000 mg | ORAL_TABLET | Freq: Every day | ORAL | Status: DC
  Administered 2018-09-30 – 2018-10-03 (×4): 10 mg via ORAL
  Filled 2018-09-30 (×4): qty 1

## 2018-09-30 MED ORDER — ONDANSETRON HCL 4 MG/2ML IJ SOLN: 4.0000 mg | Freq: Once | INTRAMUSCULAR | Status: DC | PRN

## 2018-09-30 MED ORDER — ADULT MULTIVITAMIN W/MINERALS CH
1.0000 | ORAL_TABLET | Freq: Every day | ORAL | Status: DC
  Administered 2018-10-01 – 2018-10-04 (×4): 1 via ORAL
  Filled 2018-09-30 (×4): qty 1

## 2018-09-30 MED ORDER — SUCCINYLCHOLINE CHLORIDE 20 MG/ML IJ SOLN
INTRAMUSCULAR | Status: DC | PRN
  Administered 2018-09-30: 100 mg via INTRAVENOUS

## 2018-09-30 MED ORDER — OXYCODONE HCL 5 MG PO TABS
2.5000 mg | ORAL_TABLET | ORAL | Status: DC | PRN
  Administered 2018-10-01: 5 mg via ORAL
  Filled 2018-09-30: qty 1

## 2018-09-30 MED ORDER — DIPHENHYDRAMINE HCL 12.5 MG/5ML PO ELIX
12.5000 mg | ORAL_SOLUTION | ORAL | Status: DC | PRN
  Filled 2018-09-30: qty 10

## 2018-09-30 MED ORDER — MEMANTINE HCL 5 MG PO TABS
5.0000 mg | ORAL_TABLET | Freq: Two times a day (BID) | ORAL | Status: DC
  Administered 2018-09-30 – 2018-10-04 (×8): 5 mg via ORAL
  Filled 2018-09-30 (×10): qty 1

## 2018-09-30 MED ORDER — TRAMADOL HCL 50 MG PO TABS
50.0000 mg | ORAL_TABLET | Freq: Four times a day (QID) | ORAL | Status: DC | PRN
  Administered 2018-10-02 – 2018-10-04 (×4): 50 mg via ORAL
  Filled 2018-09-30 (×4): qty 1

## 2018-09-30 MED ORDER — SUCCINYLCHOLINE CHLORIDE 20 MG/ML IJ SOLN
INTRAMUSCULAR | Status: AC
  Filled 2018-09-30: qty 1

## 2018-09-30 SURGICAL SUPPLY — 56 items
"PENCIL ELECTRO HAND CTR " (MISCELLANEOUS) ×2 IMPLANT
APL PRP STRL LF DISP 70% ISPRP (MISCELLANEOUS) ×2
BANDAGE ACE 3X5.8 VEL STRL LF (GAUZE/BANDAGES/DRESSINGS) ×2 IMPLANT
BIT DRILL 4.0X280 (BIT) ×2 IMPLANT
BLADE SURG 15 STRL LF DISP TIS (BLADE) ×2 IMPLANT
BLADE SURG 15 STRL SS (BLADE) ×4
CANISTER SUCT 1200ML W/VALVE (MISCELLANEOUS) ×4 IMPLANT
CAST PADDING 2X4YD ST 30245 (MISCELLANEOUS) ×4
CHLORAPREP W/TINT 26 (MISCELLANEOUS) ×4 IMPLANT
COVER WAND RF STERILE (DRAPES) ×4 IMPLANT
DRAPE SHEET LG 3/4 BI-LAMINATE (DRAPES) ×4 IMPLANT
DRAPE SURG 17X11 SM STRL (DRAPES) ×8 IMPLANT
DRAPE U-SHAPE 47X51 STRL (DRAPES) ×8 IMPLANT
DRSG OPSITE POSTOP 3X4 (GAUZE/BANDAGES/DRESSINGS) ×8 IMPLANT
Delta Lite Plus ×4 IMPLANT
ELECT REM PT RETURN 9FT ADLT (ELECTROSURGICAL) ×4
ELECTRODE REM PT RTRN 9FT ADLT (ELECTROSURGICAL) ×2 IMPLANT
GLOVE BIOGEL PI IND STRL 8 (GLOVE) ×2 IMPLANT
GLOVE BIOGEL PI INDICATOR 8 (GLOVE) ×2
GLOVE SURG SYN 7.5  E (GLOVE) ×2
GLOVE SURG SYN 7.5 E (GLOVE) ×2 IMPLANT
GLOVE SURG SYN 7.5 PF PI (GLOVE) ×2 IMPLANT
GOWN SRG XL LVL 3 NONREINFORCE (GOWNS) IMPLANT
GOWN STRL NON-REIN TWL XL LVL3 (GOWNS) ×4
GOWN STRL REUS W/ TWL LRG LVL3 (GOWN DISPOSABLE) ×2 IMPLANT
GOWN STRL REUS W/ TWL XL LVL3 (GOWN DISPOSABLE) ×2 IMPLANT
GOWN STRL REUS W/TWL LRG LVL3 (GOWN DISPOSABLE) ×4
GOWN STRL REUS W/TWL XL LVL3 (GOWN DISPOSABLE) ×4
GUIDE PIN 3.2X330 (PIN) ×4
GUIDEWIRE BALL NOSE 3.0X900 (WIRE) ×2 IMPLANT
KIT PATIENT CARE HANA TABLE (KITS) ×4 IMPLANT
KIT TURNOVER KIT A (KITS) ×4 IMPLANT
MAT ABSORB  FLUID 56X50 GRAY (MISCELLANEOUS) ×4
MAT ABSORB FLUID 56X50 GRAY (MISCELLANEOUS) ×4 IMPLANT
NAIL 11X33X130D LEFT (Nail) ×2 IMPLANT
NDL FILTER BLUNT 18X1 1/2 (NEEDLE) ×2 IMPLANT
NEEDLE FILTER BLUNT 18X 1/2SAF (NEEDLE) ×2
NEEDLE FILTER BLUNT 18X1 1/2 (NEEDLE) ×2 IMPLANT
NEEDLE HYPO 22GX1.5 SAFETY (NEEDLE) ×4 IMPLANT
NS IRRIG 1000ML POUR BTL (IV SOLUTION) ×4 IMPLANT
PACK HIP COMPR (MISCELLANEOUS) ×4 IMPLANT
PADDING CAST COTTON 2X4 ST (MISCELLANEOUS) IMPLANT
PENCIL ELECTRO HAND CTR (MISCELLANEOUS) ×4 IMPLANT
PIN GUIDE 3.2X330 (PIN) IMPLANT
SCREW LAG 115X10.5XLRG HIP (Screw) IMPLANT
SCREW LAG GALILEO 10.5X115 (Screw) ×4 IMPLANT
SCREW LAG GALILEO 10.5X95 (Screw) ×2 IMPLANT
SCREW LOCK CORT 5X34 (Screw) ×2 IMPLANT
STAPLER SKIN PROX 35W (STAPLE) ×4 IMPLANT
SUT VIC AB 2-0 CT2 27 (SUTURE) ×4 IMPLANT
SYR 10ML LL (SYRINGE) ×4 IMPLANT
SYR 30ML LL (SYRINGE) ×4 IMPLANT
TAPE CAST 2X4 WHT DELT NS (MISCELLANEOUS) ×4 IMPLANT
TAPE CLOTH 3X10 WHT NS LF (GAUZE/BANDAGES/DRESSINGS) ×8 IMPLANT
TOOL ACTIVATION (INSTRUMENTS) ×2 IMPLANT
cortical screw ×2 IMPLANT

## 2018-09-30 NOTE — ED Notes (Signed)
RN unavailable for report. Will return call in 10 minutes.

## 2018-09-30 NOTE — Op Note (Signed)
09/30/2018  5:41 PM  PATIENT:  Holly Burns  85 y.o. female  PRE-OPERATIVE DIAGNOSIS:  Left Hip and Wrist Fracture  POST-OPERATIVE DIAGNOSIS:  Left Hip and Wrist Fracture  PROCEDURE:  Procedure(s): INTRAMEDULLARY (IM) NAIL INTERTROCHANTRIC (Left) CLOSED REDUCTION WRIST (Left)  SURGEON: Laurene Footman, MD  ASSISTANTS: none  ANESTHESIA:   general  EBL:  Total I/O In: 558.5 [I.V.:558.5] Out: 100 [Blood:100]  BLOOD ADMINISTERED:none  DRAINS: none   LOCAL MEDICATIONS USED:  NONE  SPECIMEN:  No Specimen  DISPOSITION OF SPECIMEN:  N/A  COUNTS:  YES  TOURNIQUET:  * No tourniquets in log *  IMPLANTS: [employer] 33 cm length rod by 11 mm by her and 30 degrees with a 95 mm lag screw and a 5.03834 mm diaphyseal cortical screw  DICTATION: .Dragon Dictation patient was brought to the operating room and after adequate general anesthesia was obtained she was placed on the Hana table with the right arm on an arm board left arm over the chest with her current splint in place left leg in traction with some internal rotation and right leg in extension.  C arm was brought in and good visualization of the fracture was obtained with near anatomic alignment.  The hip was then prepped and draped in the usual barrier drape method and appropriate patient identification and timeout procedures were completed.  A small proximal incision was made and a guidewire inserted into the tip of the trochanter with proximal reaming carried out and placement of the long guidewire reaming of the canal was then carried out after measuring the rod for the length of implant.  The canal was reamed to 13 mm and the 11 mm rod inserted to the appropriate depth.  A lateral incision was made with a guidewire inserted up through the center on the lateral view slightly superior on the AP view with measurement made off of this drilling carried out and a 95 mm cortical screw placed the l compression device  was used with traction released from the leg to get good compression at the fracture site.  AP and lateral imaging showed acceptable position and so the blue setscrew was removed and the insertion handle also removed at this time from the leg screw a second diaphyseal screw was then placed through the guide and measured 34 mm and got good fixation on me both sides of the cortex.  The insertion handle was removed at this time completely and the wounds thoroughly irrigated.  #1 Vicryl was used to close the defect in the deep fascia 2-0 Vicryl subcutaneously and skin staples with Xeroform and honeycomb dressings applied.  Next the bed was lowered and the prior splint removed with traction applied a well-padded short arm cast was applied molding it to correct some dorsal angulation of the fracture with clinically acceptable position.  Patient was then sent to recovery in stable condition  PLAN OF CARE: Continue as inpatient  PATIENT DISPOSITION:  PACU - hemodynamically stable.

## 2018-09-30 NOTE — H&P (Signed)
[place] [facility] [place] NAME: Holly Burns    MR#:  [id number]  DATE OF BIRTH:  22-Apr-1924  DATE OF ADMISSION:  09/29/2018  PRIMARY CARE PHYSICIAN: System, Pcp Not In   REQUESTING/REFERRING PHYSICIAN: Merlyn Lot, MD  CHIEF COMPLAINT:   Chief Complaint  Patient presents with   Fall   Hip Pain    HISTORY OF PRESENT ILLNESS:  Holly Burns  is a 85 y.o. female with a known history of CHF, dementia, hypertension, hyperlipidemia, GERD.  Patient presented to the emergency room via EMS services accompanied by her son at the bedside.  Son reports patient had fallen in the bathroom unable to call for help.  Patient was therefore lying on the floor for approximately 3 hours according to her son.  Patient denies loss of consciousness.  However patient has early stages of dementia.  She continues to live alone in an apartment which is near to her son and daughter.  They check on her several times a day.  Patient is currently complaining of pain in her left hip and left wrist.  However, she is a difficult historian given her history of dementia.  Patient's son denies recent illness.  He has noted no recent fevers, chills, nausea, vomiting, diarrhea.  No complaints of chest pain or increased shortness of breath.  He has noted no recent increase in weakness.  Patient usually ambulates from room to room in her home without significant amount of difficulty. Shortly after her arrival to the emergency room, patient was found to be in atrial fibrillation with RVR.  She was started on Cardizem infusion.  Her home medications including Cardizem was continued as well.  Chest x-ray demonstrates acute minimally displaced fractures involving the sixth through eighth ribs on the left.  Left upper extremity x-ray demonstrates acute impacted intra-articular fracture of the distal radius. Left hip x-ray demonstrates acute comminuted displaced intratrochanteric  fracture of the left femur.  She has been admitted to the hospital service for further management.  Dr. Posey Pronto was consulted by the ED physician planning surgical intervention tomorrow. PAST MEDICAL HISTORY:   Past Medical History:  Diagnosis Date   CHF (congestive heart failure) (HCC)    Depression    GERD (gastroesophageal reflux disease)    Hyperlipidemia    Hypertension    Thyroid disease     PAST SURGICAL HISTORY:   Past Surgical History:  Procedure Laterality Date   ABDOMINAL HYSTERECTOMY  1980   ANGIOPLASTY     due to MI LV ef-40%   cataract surgery Left    CRANIOTOMY     MASTECTOMY Left 02/26/1995   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, no chemo or radiation     SOCIAL HISTORY:   Social History   Tobacco Use   Smoking status: Never Smoker   Smokeless tobacco: Never Used  Substance Use Topics   Alcohol use: No    FAMILY HISTORY:   Family History  Problem Relation Age of Onset   Hypertension Mother    Cerebrovascular Accident Mother    Heart disease Father     DRUG ALLERGIES:   Allergies  Allergen Reactions   Codeine Other (See Comments)    Reaction: unknown   Penicillins Other (See Comments)    Unknown reaction and unable to answer follow-up questions   Phenytoin Sodium Extended Other (See Comments)    Hyperthermia    REVIEW OF SYSTEMS:   Review of Systems  Constitutional:  Positive for malaise/fatigue. Negative for chills, diaphoresis, fever and weight loss.  HENT: Negative for congestion, sinus pain and sore throat.   Eyes: Negative for blurred vision and double vision.  Respiratory: Negative for sputum production, shortness of breath and wheezing.   Cardiovascular: Negative for chest pain and palpitations.  Gastrointestinal: Negative for abdominal pain, heartburn, nausea and vomiting.  Genitourinary: Positive for frequency. Negative for dysuria, flank pain, hematuria and urgency.  Musculoskeletal: Positive for  falls (Patient fell in the bathroom onto her left hip and left wrist). Negative for neck pain.  Skin: Negative.  Negative for itching and rash.  Neurological: Positive for weakness. Negative for dizziness, tingling, tremors, sensory change, seizures, loss of consciousness and headaches.  Psychiatric/Behavioral: Negative.       MEDICATIONS AT HOME:   Prior to Admission medications   Medication Sig Start Date End Date Taking? Authorizing Provider  acetaminophen (TYLENOL) 500 MG tablet Take 500-1,000 mg by mouth every 6 (six) hours as needed.    Yes [provider]  diltiazem (CARDIZEM CD) 240 MG 24 hr capsule Take 1 capsule (240 mg total) by mouth daily as needed. 04/13/15  Yes Margarita Rana, MD  donepezil (ARICEPT) 10 MG tablet Take 10 mg by mouth at bedtime.   Yes [provider]  ELIQUIS 2.5 MG TABS tablet TAKE (1) TABLET TWICE A DAY. 05/23/15  Yes Margarita Rana, MD  escitalopram (LEXAPRO) 10 MG tablet Take 10 mg by mouth 2 (two) times a day.    Yes [provider]  furosemide (LASIX) 20 MG tablet Take 1 tablet (20 mg total) by mouth 2 (two) times daily. 01/29/15  Yes Margarita Rana, MD  levothyroxine (SYNTHROID) 75 MCG tablet Take 1 tablet (75 mcg total) by mouth daily before breakfast. 12/05/14  Yes Margarita Rana, MD  LORazepam (ATIVAN) 1 MG tablet Take 1 tablet (1 mg total) by mouth every 4 (four) hours as needed for anxiety. Patient taking differently: Take 0.5-1 mg by mouth every 4 (four) hours as needed for anxiety.  03/03/15  Yes Margarita Rana, MD  memantine (NAMENDA) 10 MG tablet Take 10 mg by mouth 2 (two) times daily.  01/29/11  Yes [provider]  metoprolol succinate (TOPROL-XL) 25 MG 24 hr tablet Take 1 tablet (25 mg total) by mouth daily. 03/03/15  Yes Margarita Rana, MD  omeprazole (PRILOSEC) 20 MG capsule Take 20 mg by mouth daily.    Yes [provider]  OXYGEN 2 L.   Yes [provider]  potassium chloride (MICRO-K)  10 MEQ CR capsule Take 1 capsule (10 mEq total) by mouth daily. 11/07/14  Yes Margarita Rana, MD  ramipril (ALTACE) 5 MG capsule Take 1 capsule (5 mg total) by mouth daily. 04/02/15  Yes Henreitta Leber, MD  simvastatin (ZOCOR) 20 MG tablet Take 1 tablet (20 mg total) by mouth at bedtime. 01/10/15  Yes Margarita Rana, MD  vitamin C (ASCORBIC ACID) 500 MG tablet Take 1,000 mg by mouth daily.    Yes [provider]      VITAL SIGNS:  Blood pressure (!) 140/56, pulse 95, temperature (!) 97.4 F (36.3 C), temperature source Oral, resp. rate 16, height 5\' 2"  (1.575 m), weight 55 kg, SpO2 94 %.  PHYSICAL EXAMINATION:  Physical Exam Vitals signs and nursing note reviewed.  Constitutional:      General: She is not in acute distress.    Appearance: Normal appearance. She is normal weight.  HENT:     Head: Normocephalic.  Right Ear: External ear normal.     Left Ear: External ear normal.     Nose: Nose normal.     Mouth/Throat:     Mouth: Mucous membranes are moist.     Pharynx: Oropharynx is clear.  Eyes:     General: No scleral icterus.    Extraocular Movements: Extraocular movements intact.     Conjunctiva/sclera: Conjunctivae normal.     Pupils: Pupils are equal, round, and reactive to light.  Neck:     Musculoskeletal: Normal range of motion and neck supple. No muscular tenderness.  Cardiovascular:     Rate and Rhythm: Normal rate and regular rhythm.     Pulses: Normal pulses.     Heart sounds: Normal heart sounds. No murmur. No friction rub. No gallop.   Pulmonary:     Effort: Pulmonary effort is normal. No respiratory distress.     Breath sounds: Normal breath sounds. No wheezing, rhonchi or rales.  Chest:     Chest wall: No tenderness.  Abdominal:     General: Abdomen is flat. Bowel sounds are normal.     Palpations: Abdomen is soft.  Musculoskeletal:        General: Tenderness (Left wrist, left hip and lower extremity) present. No swelling.     Right lower leg:  No edema.     Left lower leg: No edema.  Skin:    General: Skin is warm and dry.     Capillary Refill: Capillary refill takes less than 2 seconds.     Findings: No lesion or rash.  Neurological:     Mental Status: She is alert. Mental status is at baseline.     Motor: Weakness present.     Comments: Patient oriented to person and place only  Psychiatric:        Mood and Affect: Mood normal.        Behavior: Behavior normal.      LABORATORY PANEL:   CBC Recent Labs  Lab 09/29/18 1801  WBC 21.1*  HGB 11.3*  HCT 34.6*  PLT 296   ------------------------------------------------------------------------------------------------------------------  Chemistries  Recent Labs  Lab 09/29/18 1903  NA 142  K 3.8  CL 104  CO2 21*  GLUCOSE 280*  BUN 30*  CREATININE 1.33*  CALCIUM 9.0  AST 32  ALT 17  ALKPHOS 42  BILITOT 1.7*   ------------------------------------------------------------------------------------------------------------------  Cardiac Enzymes Recent Labs  Lab 09/29/18 1903  TROPONINI <0.03   ------------------------------------------------------------------------------------------------------------------  RADIOLOGY:  Dg Chest 1 View  Result Date: 09/29/2018 CLINICAL DATA:  Pain status post fall. EXAM: CHEST  1 VIEW COMPARISON:  03/29/2015 FINDINGS: Heart size is enlarged. There are aortic calcifications. Again noted are findings suspicious for a hiatal hernia. There is no acute osseous abnormality. Surgical clips are noted along the patient's left axilla. No large pleural effusion. No focal infiltrate. There are minimally displaced fractures involving the 6 through eighth ribs on the left. There is a deep sulcus sign on the left. IMPRESSION: 1. Acute minimally displaced fractures involving the sixth through eighth ribs on the left. There is a deep sulcus on the left concerning for left-sided pneumothorax. Consider upright radiograph for further evaluation of  this finding or a follow-up CT. 2. Mild cardiomegaly with aortic calcifications. These results were called by telephone at the time of interpretation on 09/29/2018 at 7:04 pm to [facility] , who verbally acknowledged these results. Electronically Signed   By: Constance Holster M.D.   On: 09/29/2018 19:07   Dg  Wrist Complete Left  Result Date: 09/29/2018 CLINICAL DATA:  Pain status post fall EXAM: LEFT WRIST - COMPLETE 3+ VIEW COMPARISON:  None. FINDINGS: There is an acute impacted intra-articular fracture of the distal radius. There is some mild dorsal angulation of the distal fracture fragment. There is an acute minimally displaced fracture of the ulnar styloid process. There are advanced degenerative changes of the first carpometacarpal joint. There is osteopenia. IMPRESSION: 1. Acute impacted intra-articular fracture of the distal radius. 2. Acute minimally displaced fracture of the ulnar styloid process. 3. Osteopenia Electronically Signed   By: Constance Holster M.D.   On: 09/29/2018 19:09   Ct Head Wo Contrast  Result Date: 09/29/2018 CLINICAL DATA:  Status post fall, leg deformity EXAM: CT HEAD WITHOUT CONTRAST CT CERVICAL SPINE WITHOUT CONTRAST TECHNIQUE: Multidetector CT imaging of the head and cervical spine was performed following the standard protocol without intravenous contrast. Multiplanar CT image reconstructions of the cervical spine were also generated. COMPARISON:  None. FINDINGS: CT HEAD FINDINGS Brain: No evidence of acute infarction, hemorrhage, extra-axial collection, ventriculomegaly, or mass effect. Generalized cerebral atrophy. Periventricular white matter low attenuation likely secondary to microangiopathy. Vascular: Cerebrovascular atherosclerotic calcifications are noted. Skull: Negative for fracture or focal lesion. Prior right frontoparietal craniotomy. Sinuses/Orbits: Visualized portions of the orbits are unremarkable. Visualized portions of the paranasal sinuses and  mastoid air cells are unremarkable. Other: None. CT CERVICAL SPINE FINDINGS Alignment: Normal. Skull base and vertebrae: No acute compression fracture. Chronic T1 vertebral body compression fracture. No primary bone lesion or focal pathologic process. Soft tissues and spinal canal: No prevertebral fluid or swelling. No visible canal hematoma. Disc levels: Degenerative disc disease with disc height loss at C3-4, C4-5, C5-6, C6-7. broad-based disc osteophyte complex at C3-4 with bilateral foraminal stenosis and bilateral facet arthropathy. Upper chest: Lung apices are clear. Other: No fluid collection or hematoma. IMPRESSION: 1. No acute intracranial pathology. 2.  No acute osseous injury of the cervical spine. 3. Cervical spine spondylosis as described above. Electronically Signed   By: Kathreen Devoid   On: 09/29/2018 18:39   Ct Chest W Contrast  Result Date: 09/29/2018 CLINICAL DATA:  Trauma. EXAM: CT CHEST WITH CONTRAST TECHNIQUE: Multidetector CT imaging of the chest was performed during intravenous contrast administration. CONTRAST:  38mL OMNIPAQUE IOHEXOL 300 MG/ML  SOLN COMPARISON:  X-ray from same day. FINDINGS: Cardiovascular: The heart is significantly enlarged. There are coronary artery calcifications. There is no large centrally located pulmonary embolus. Detection of smaller segmental and subsegmental pulmonary emboli is severely limited. Aortic calcifications are noted. There is no significant pericardial effusion. Mediastinum/Nodes: No enlarged mediastinal, hilar, or axillary lymph nodes. Thyroid gland, trachea, and esophagus demonstrate no significant findings. Lungs/Pleura: There is no pneumothorax. No large pleural effusion. There are scattered areas of atelectasis and scarring bilaterally. Upper Abdomen: The gallbladder is moderately distended. There is slightly greater than expected density near the pancreatic head, which is only partially visualized. There is a large hiatal hernia. The remaining  portions of the pancreas are essentially unremarkable. Musculoskeletal: There are displaced and nondisplaced fractures involving the sixth through ninth ribs on the left. There is no pneumothorax. There is sclerosis of the T2 vertebral body. IMPRESSION: 1. Multiple acute displaced and nondisplaced left-sided rib fractures as above with no evidence of a left-sided pneumothorax. 2. Sclerosis of the T2 vertebral body likely represents an age-indeterminate compression fracture. Correlation with point tenderness is recommended. 3. Partially visualized density near the pancreatic head, only partially visualized and suboptimally evaluated on  this exam. Correlation with an outpatient contrast enhanced CT is recommended for further evaluation of this finding. 4.  Aortic Atherosclerosis (ICD10-I70.0). 5. Large hiatal hernia with the majority of the stomach located within the thoracic cavity. 6. Cardiomegaly. Electronically Signed   By: Constance Holster M.D.   On: 09/29/2018 20:25   Ct Cervical Spine Wo Contrast  Result Date: 09/29/2018 CLINICAL DATA:  Status post fall, leg deformity EXAM: CT HEAD WITHOUT CONTRAST CT CERVICAL SPINE WITHOUT CONTRAST TECHNIQUE: Multidetector CT imaging of the head and cervical spine was performed following the standard protocol without intravenous contrast. Multiplanar CT image reconstructions of the cervical spine were also generated. COMPARISON:  None. FINDINGS: CT HEAD FINDINGS Brain: No evidence of acute infarction, hemorrhage, extra-axial collection, ventriculomegaly, or mass effect. Generalized cerebral atrophy. Periventricular white matter low attenuation likely secondary to microangiopathy. Vascular: Cerebrovascular atherosclerotic calcifications are noted. Skull: Negative for fracture or focal lesion. Prior right frontoparietal craniotomy. Sinuses/Orbits: Visualized portions of the orbits are unremarkable. Visualized portions of the paranasal sinuses and mastoid air cells are  unremarkable. Other: None. CT CERVICAL SPINE FINDINGS Alignment: Normal. Skull base and vertebrae: No acute compression fracture. Chronic T1 vertebral body compression fracture. No primary bone lesion or focal pathologic process. Soft tissues and spinal canal: No prevertebral fluid or swelling. No visible canal hematoma. Disc levels: Degenerative disc disease with disc height loss at C3-4, C4-5, C5-6, C6-7. broad-based disc osteophyte complex at C3-4 with bilateral foraminal stenosis and bilateral facet arthropathy. Upper chest: Lung apices are clear. Other: No fluid collection or hematoma. IMPRESSION: 1. No acute intracranial pathology. 2.  No acute osseous injury of the cervical spine. 3. Cervical spine spondylosis as described above. Electronically Signed   By: Kathreen Devoid   On: 09/29/2018 18:39   Chest Portable 1 View  Result Date: 09/30/2018 CLINICAL DATA:  Atrial fibrillation EXAM: PORTABLE CHEST 1 VIEW COMPARISON:  09/29/2018 FINDINGS: Cardiomegaly. No confluent airspace opacities, effusions or pneumothorax. Previously seen left side rib fractures are better seen on prior studies. Aortic atherosclerosis. IMPRESSION: Mild cardiomegaly.  No acute cardiopulmonary disease. Electronically Signed   By: Rolm Baptise M.D.   On: 09/30/2018 00:00   Dg Hip Unilat W Or Wo Pelvis 2-3 Views Left  Result Date: 09/29/2018 CLINICAL DATA:  Pain status post fall EXAM: DG HIP (WITH OR WITHOUT PELVIS) 2-3V LEFT COMPARISON:  None. FINDINGS: There is an acute comminuted intratrochanteric fracture of the proximal left femur. There is no dislocation. There is diffuse osteopenia. Phleboliths project over the patient's pelvis. There are degenerative changes of the right hip. IMPRESSION: Acute comminuted displaced intratrochanteric fracture of the left femur. Electronically Signed   By: Constance Holster M.D.   On: 09/29/2018 19:08      IMPRESSION AND PLAN:   1.  Left intertrochanteric fracture - Dr. Posey Pronto with  orthopedic surgery consulted and planning surgical intervention tomorrow - Pain is being managed with analgesic - We will hold Eliquis pending surgical intervention  2.  Left wrist fracture - Splint being applied in the emergency room  3.  Atrial fibrillation with RVR - IV Cardizem initiated - We will consult cardiology for further evaluation and recommendations - P.o. Cardizem continued - Telemetry monitoring - Repeat CBC and BMP in the a.m.  4.  Urinary tract infection - Urine culture pending - IV Rocephin - Will adjust management based on culture results  5.  Dementia - Aricept and Namenda continued  DVT and PPI prophylaxis   All the records are reviewed and  case discussed with ED provider. The plan of care was discussed in details with the patient (and family). I answered all questions. The patient agreed to proceed with the above mentioned plan. Further management will depend upon hospital course.   CODE STATUS: Full code  TOTAL TIME TAKING CARE OF THIS PATIENT: 45 minutes.    [place] on 09/30/2018 at 3:31 AM  Pager - [phone]  After 6pm go to [email or web address] - Proofreader  [facility]  Office  [phone]  CC: Primary care physician; System, Pcp Not In   Note: This dictation was prepared with Dragon dictation along with smaller phrase technology. Any transcriptional errors that result from this process are unintentional.

## 2018-09-30 NOTE — TOC Progression Note (Signed)
Transition of Care [facility]) - Progression Note    Patient Details  Name: Holly Burns MRN: [id number] Date of Birth: [birth year]  [facility] CM/SW Contact  Beverly Sessions, RN Phone Number: 09/30/2018, 4:37 PM  Clinical Narrative:    Received call from [place] at [facility]. Patient is followed by [facility].  Patient has in home services Monday and Wednesday evening, and Tuesday and Friday morning.      Barriers to Discharge: Continued Medical Work up  Expected Discharge Plan and Services                                                 Social Determinants of Health (SDOH) Interventions    Readmission Risk Interventions No flowsheet data found.

## 2018-09-30 NOTE — Anesthesia Postprocedure Evaluation (Signed)
Anesthesia Post Note  Patient: Holly Burns  Procedure(s) Performed: INTRAMEDULLARY (IM) NAIL INTERTROCHANTRIC (Left Hip) CLOSED REDUCTION WRIST (Left Wrist)  Patient location during evaluation: PACU Anesthesia Type: General Burns of consciousness: awake and alert Pain management: pain Burns controlled Vital Signs Assessment: post-procedure vital signs reviewed and stable Respiratory status: spontaneous breathing, nonlabored ventilation, respiratory function stable and patient connected to nasal cannula oxygen Cardiovascular status: blood pressure returned to baseline and stable Postop Assessment: no apparent nausea or vomiting Anesthetic complications: no     Last Vitals:  Vitals:   09/30/18 1804 09/30/18 1819  BP: (!) 117/53 (!) 123/56  Pulse: (!) 116 (!) 118  Resp: 19 17  Temp:    SpO2: 92% 93%    Last Pain:  Vitals:   09/30/18 1804  TempSrc:   PainSc: 5                  Molli Barrows

## 2018-09-30 NOTE — Anesthesia Post-op Follow-up Note (Signed)
Anesthesia QCDR form completed.        

## 2018-09-30 NOTE — NC FL2 (Signed)
[place] LEVEL OF CARE SCREENING TOOL     IDENTIFICATION  Patient Name: Holly Burns Birthdate: 1924-12-08 Sex: female Admission Date (Current Location): 09/29/2018  [facility] and [place] Number:  Engineering geologist and Address:         Provider Number: [id number]  Attending Physician Name and Address:  Hillary Bow, MD  Relative Name and Phone Number:       Current Level of Care: Hospital Recommended Level of Care: [place] Prior Approval Number:    Date Approved/Denied:   PASRR Number: [id number]  Discharge Plan: SNF    Current Diagnoses: Patient Active Problem List   Diagnosis Date Noted  . Intertrochanteric fracture, closed, left, initial encounter ([place]) 09/29/2018  . Fall 09/29/2018  . Atrial fibrillation with RVR ([place]) 03/30/2015  . Adult idiopathic generalized osteoporosis 03/12/2015  . Anxiety 10/13/2014  . B12 deficiency 10/13/2014  . Breast CA ([place]) 10/13/2014  . Atherosclerosis of coronary artery 10/13/2014  . Chronic kidney disease (CKD), stage III (moderate) ([place]) 10/13/2014  . CCF (congestive cardiac failure) ([place]) 10/13/2014  . Clinical depression 10/13/2014  . DD (diverticular disease) 10/13/2014  . Bleeding gastrointestinal 10/13/2014  . Acid reflux 10/13/2014  . 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 Hypercholesteremia 10/13/2014  . BP (high blood pressure) 10/13/2014  . Decreased potassium in the blood 10/13/2014  . Adult hypothyroidism 10/13/2014  . Hypoxia 10/13/2014  . OP (osteoporosis) 10/13/2014  . Dementia ([place]) 10/13/2014  . Avitaminosis D 10/13/2014  . Involutional osteoporosis 01/02/2014  . Heart attack ([place]) 09/12/2013  . H/O cardiac catheterization 09/12/2013  . Brain tumor ([place]) 09/12/2013    Orientation RESPIRATION BLADDER Height & Weight     Self, Place  Normal Continent Weight: 121 lb 4.1 oz (55 kg) Height:  5\' 2"  (157.5 cm)  BEHAVIORAL SYMPTOMS/MOOD NEUROLOGICAL BOWEL NUTRITION  STATUS      Continent Diet(Diet: NPO for surgery to be advanced.)  AMBULATORY STATUS COMMUNICATION OF NEEDS Skin   Extensive Assist Verbally Surgical wounds                       Personal Care Assistance Level of Assistance  Bathing, Feeding, Dressing Bathing Assistance: Limited assistance Feeding assistance: Limited assistance Dressing Assistance: Limited assistance     Functional Limitations Info  Sight, Speech, Hearing Sight Info: Adequate Hearing Info: Adequate Speech Info: Adequate    SPECIAL CARE FACTORS FREQUENCY  PT (By licensed PT), OT (By licensed OT)     PT Frequency: 5 OT Frequency: 5            Contractures      Additional Factors Info  Code Status, Allergies Code Status Info: Full Code. Allergies Info: Codeine, Penicillins, Phenytoin Sodium, Extended           Current Medications (09/30/2018):  This is the current hospital active medication list Current Facility-Administered Medications  Medication Dose Route Frequency Provider Last Rate Last Dose  . [MAR Hold] cefTRIAXone (ROCEPHIN) 1 g in sodium chloride 0.9 % 100 mL IVPB  1 g Intravenous Q24H Seals, Theo Dills, NP      . Doug Sou Hold] diltiazem (CARDIZEM CD) 24 hr capsule 240 mg  240 mg Oral Once Merlyn Lot, MD      . Doug Sou Hold] diltiazem (CARDIZEM CD) 24 hr capsule 240 mg  240 mg Oral Daily Corey Skains, MD   240 mg at 09/30/18 0829  . [MAR Hold] donepezil (ARICEPT) tablet 10 mg  10 mg Oral  QHS Seals, Theo Dills, NP   10 mg at 09/30/18 0149  . [MAR Hold] escitalopram (LEXAPRO) tablet 10 mg  10 mg Oral BID Gardiner Barefoot H, NP   10 mg at 09/30/18 0215  . [MAR Hold] feeding supplement (ENSURE ENLIVE) (ENSURE ENLIVE) liquid 237 mL  237 mL Oral BID BM Sudini, Srikar, MD      . Doug Sou Hold] furosemide (LASIX) tablet 20 mg  20 mg Oral BID Seals, Theo Dills, NP      . Doug Sou Hold] HYDROcodone-acetaminophen (NORCO/VICODIN) 5-325 MG per tablet 1-2 tablet  1-2 tablet Oral Q6H PRN Mayer Camel, NP   1  tablet at 09/30/18 1436  . [MAR Hold] levothyroxine (SYNTHROID) tablet 75 mcg  75 mcg Oral QAC breakfast Gardiner Barefoot H, NP   75 mcg at 09/30/18 0829  . [MAR Hold] lidocaine (LIDODERM) 5 % 1 patch  1 patch Transdermal Q24H Merlyn Lot, MD   1 patch at 09/29/18 2139  . [MAR Hold] memantine (NAMENDA) tablet 5 mg  5 mg Oral BID Hillary Bow, MD      . Doug Sou Hold] metoprolol succinate (TOPROL-XL) 24 hr tablet 50 mg  50 mg Oral Daily Hillary Bow, MD   50 mg at 09/30/18 0829  . [MAR Hold] morphine 2 MG/ML injection 0.5 mg  0.5 mg Intravenous Q2H PRN Seals, Theo Dills, NP      . Doug Sou Hold] multivitamin with minerals tablet 1 tablet  1 tablet Oral Daily Sudini, Srikar, MD      . Doug Sou Hold] pantoprazole (PROTONIX) EC tablet 40 mg  40 mg Oral Daily Seals, Theo Dills, NP      . Doug Sou Hold] polyethylene glycol (MIRALAX / GLYCOLAX) packet 17 g  17 g Oral Daily PRN Seals, Theo Dills, NP      . Doug Sou Hold] potassium chloride SA (K-DUR) CR tablet 10 mEq  10 mEq Oral Daily Seals, Theo Dills, NP      . Doug Sou Hold] ramipril (ALTACE) capsule 5 mg  5 mg Oral Daily Seals, Theo Dills, NP      . Doug Sou Hold] simvastatin (ZOCOR) tablet 10 mg  10 mg Oral QHS Sudini, Alveta Heimlich, MD      . Doug Sou Hold] vitamin C (ASCORBIC ACID) tablet 1,000 mg  1,000 mg Oral Daily Seals, Theo Dills, NP       Facility-Administered Medications Ordered in Other Encounters  Medication Dose Route Frequency Provider Last Rate Last Dose  . ceFAZolin (ANCEF) IVPB 1 g/50 mL premix   Intravenous Anesthesia Intra-op Fletcher-Harrison, Tawana, CRNA   1 g at 09/30/18 1610  . ePHEDrine injection   Intravenous Anesthesia Intra-op Fletcher-Harrison, Tawana, CRNA   5 mg at 09/30/18 1701  . fentaNYL (SUBLIMAZE) injection    Anesthesia Intra-op Fletcher-Harrison, Nira Conn, CRNA   25 mcg at 09/30/18 1605  . lactated ringers infusion    Continuous PRN Fletcher-Harrison, Tawana, CRNA      . lidocaine (cardiac) 100 mg/67mL (XYLOCAINE) injection 2%   Intravenous Anesthesia  Intra-op Fletcher-Harrison, Tawana, CRNA   40 mg at 09/30/18 1607  . ondansetron (ZOFRAN) injection   Intravenous Anesthesia Intra-op Fletcher-Harrison, Tawana, CRNA   4 mg at 09/30/18 1640  . phenylephrine (NEO-SYNEPHRINE) 100 mcg/mL in sodium chloride 0.9 % 100 mL infusion   Intravenous Continuous PRN Fletcher-Harrison, Tawana, CRNA 30 mL/hr at 09/30/18 1636 50 mcg/min at 09/30/18 1636  . phenylephrine (NEO-SYNEPHRINE) injection   Intravenous Anesthesia Intra-op Fletcher-Harrison, Tawana, CRNA   100 mcg at 09/30/18 1616  . propofol (DIPRIVAN)  10 mg/mL bolus/IV push    Anesthesia Intra-op Fletcher-Harrison, Tawana, CRNA   20 mg at 09/30/18 1608  . rocuronium (ZEMURON) injection    Anesthesia Intra-op Fletcher-Harrison, Tawana, CRNA   30 mg at 09/30/18 1612  . succinylcholine (ANECTINE) injection    Anesthesia Intra-op Kelton Pillar, CRNA   100 mg at 09/30/18 1608     Discharge Medications: Please see discharge summary for a list of discharge medications.  Relevant Imaging Results:  Relevant Lab Results:   Additional Information SSN: [id number]  Tamar Lipscomb, Veronia Beets, LCSW

## 2018-09-30 NOTE — TOC Initial Note (Signed)
Transition of Care [facility]) - Initial/Assessment Note    Patient Details  Name: Holly Burns MRN: [id number] Date of Birth: 25-Jul-1924  Transition of [facility]) CM/SW Contact:    Beverly Sessions, RN Phone Number: 09/30/2018, 11:59 AM  Clinical Narrative:                  Patient admitted from home with left intertrochanteric fracture.    Patient with history of dementia.  Attempted to call son to complete assessment. Voicemail left.  Awaiting return call   Per chart review patient lives at home alone.  Children live locally for support.     Per chart review patient has chronic O2 with [facility]  Previously active with [facility].    Plan for surgery by Ortho.  Will need PT eval once medically cleared    Barriers to Discharge: Continued Medical Work up   Patient Goals and CMS Choice        Expected Discharge Plan and Services                                                Prior Living Arrangements/Services                  Current home services: DME    Activities of Daily Living   ADL Screening (condition at time of admission) Patient's cognitive ability adequate to safely complete daily activities?: No Is the patient deaf or have difficulty hearing?: No Does the patient have difficulty concentrating, remembering, or making decisions?: No  Permission Sought/Granted                  Emotional Assessment           Psych Involvement: No (comment)  Admission diagnosis:  Atrial fibrillation with rapid ventricular response ([place]) [I48.91] Atrial fibrillation with RVR ([place]) [I48.91] Closed displaced intertrochanteric fracture of left femur, initial encounter ([place]) [S72.142A] Closed fracture of distal end of left radius, unspecified fracture morphology, initial encounter [S52.502A] Patient Active Problem List   Diagnosis Date Noted  . Intertrochanteric fracture, closed, left, initial encounter ([place]) 09/29/2018   . Fall 09/29/2018  . Atrial fibrillation with RVR ([place]) 03/30/2015  . Adult idiopathic generalized osteoporosis 03/12/2015  . Anxiety 10/13/2014  . B12 deficiency 10/13/2014  . Breast CA ([place]) 10/13/2014  . Atherosclerosis of coronary artery 10/13/2014  . Chronic kidney disease (CKD), stage III (moderate) ([place]) 10/13/2014  . CCF (congestive cardiac failure) ([place]) 10/13/2014  . Clinical depression 10/13/2014  . DD (diverticular disease) 10/13/2014  . Bleeding gastrointestinal 10/13/2014  . Acid reflux 10/13/2014  . 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 Hypercholesteremia 10/13/2014  . BP (high blood pressure) 10/13/2014  . Decreased potassium in the blood 10/13/2014  . Adult hypothyroidism 10/13/2014  . Hypoxia 10/13/2014  . OP (osteoporosis) 10/13/2014  . Dementia ([place]) 10/13/2014  . Avitaminosis D 10/13/2014  . Involutional osteoporosis 01/02/2014  . Heart attack ([place]) 09/12/2013  . H/O cardiac catheterization 09/12/2013  . Brain tumor ([place]) 09/12/2013   PCP:  System, Pcp Not In Pharmacy:   [place] [address] Phone: [phone] Fax: [place], [place] - [address] [place] [address] Phone: [phone] Fax: [phone]  Social Determinants of Health (SDOH) Interventions    Readmission Risk Interventions No flowsheet data found.

## 2018-09-30 NOTE — Progress Notes (Signed)
Holly Burns at [place] NAME: Holly Burns    MR#:  [id number]  DATE OF BIRTH:  December 07, 1924  SUBJECTIVE:  CHIEF COMPLAINT:   Chief Complaint  Patient presents with  . Fall  . Hip Pain   Complains of left-sided chest pain where she has rib fractures  REVIEW OF SYSTEMS:    Review of Systems  Constitutional: Positive for malaise/fatigue. Negative for chills and fever.  HENT: Negative for sore throat.   Eyes: Negative for blurred vision, double vision and pain.  Respiratory: Negative for cough, hemoptysis, shortness of breath and wheezing.   Cardiovascular: Positive for chest pain. Negative for palpitations, orthopnea and leg swelling.  Gastrointestinal: Negative for abdominal pain, constipation, diarrhea, heartburn, nausea and vomiting.  Genitourinary: Negative for dysuria and hematuria.  Musculoskeletal: Negative for back pain and joint pain.  Skin: Negative for rash.  Neurological: Negative for sensory change, speech change, focal weakness and headaches.  Endo/Heme/Allergies: Does not bruise/bleed easily.  Psychiatric/Behavioral: Negative for depression. The patient is not nervous/anxious.     DRUG ALLERGIES:   Allergies  Allergen Reactions  . Codeine Other (See Comments)    Reaction: unknown  . Penicillins Other (See Comments)    Unknown reaction and unable to answer follow-up questions  . Phenytoin Sodium Extended Other (See Comments)    Hyperthermia    VITALS:  Blood pressure (!) 123/41, pulse 93, temperature 97.8 F (36.6 C), temperature source Oral, resp. rate 18, height 5\' 2"  (1.575 m), weight 55 kg, SpO2 94 %.  PHYSICAL EXAMINATION:   Physical Exam  GENERAL:  85 y.o.-year-old patient lying in the bed with no acute distress.  EYES: Pupils equal, round, reactive to light and accommodation. No scleral icterus. Extraocular muscles intact.  HEENT: Head atraumatic, normocephalic. Oropharynx and nasopharynx clear.  NECK:   Supple, no jugular venous distention. No thyroid enlargement, no tenderness.  LUNGS: Normal breath sounds bilaterally, no wheezing, rales, rhonchi. No use of accessory muscles of respiration.  CARDIOVASCULAR: Irregularly irregular and tachycardic ABDOMEN: Soft, nontender, nondistended. Bowel sounds present. No organomegaly or mass.  EXTREMITIES: No cyanosis, clubbing or edema b/l.    NEUROLOGIC: Cranial nerves II through XII are intact. No focal Motor or sensory deficits b/l.   PSYCHIATRIC: The patient is alert and awake SKIN: No obvious rash, lesion, or ulcer.   LABORATORY PANEL:   CBC Recent Labs  Lab 09/30/18 0434  WBC 21.0*  HGB 10.2*  HCT 31.1*  PLT 281   ------------------------------------------------------------------------------------------------------------------ Chemistries  Recent Labs  Lab 09/29/18 1903 09/30/18 0434  NA 142 144  K 3.8 4.3  CL 104 105  CO2 21* 25  GLUCOSE 280* 179*  BUN 30* 37*  CREATININE 1.33* 1.77*  CALCIUM 9.0 8.8*  AST 32  --   ALT 17  --   ALKPHOS 42  --   BILITOT 1.7*  --    ------------------------------------------------------------------------------------------------------------------  Cardiac Enzymes Recent Labs  Lab 09/29/18 1903  TROPONINI <0.03   ------------------------------------------------------------------------------------------------------------------  RADIOLOGY:  Dg Chest 1 View  Result Date: 09/29/2018 CLINICAL DATA:  Pain status post fall. EXAM: CHEST  1 VIEW COMPARISON:  03/29/2015 FINDINGS: Heart size is enlarged. There are aortic calcifications. Again noted are findings suspicious for a hiatal hernia. There is no acute osseous abnormality. Surgical clips are noted along the patient's left axilla. No large pleural effusion. No focal infiltrate. There are minimally displaced fractures involving the 6 through eighth ribs on the left. There is a deep sulcus sign  on the left. IMPRESSION: 1. Acute minimally  displaced fractures involving the sixth through eighth ribs on the left. There is a deep sulcus on the left concerning for left-sided pneumothorax. Consider upright radiograph for further evaluation of this finding or a follow-up CT. 2. Mild cardiomegaly with aortic calcifications. These results were called by telephone at the time of interpretation on 09/29/2018 at 7:04 pm to [facility] , who verbally acknowledged these results. Electronically Signed   By: Constance Holster M.D.   On: 09/29/2018 19:07   Dg Wrist Complete Left  Result Date: 09/29/2018 CLINICAL DATA:  Pain status post fall EXAM: LEFT WRIST - COMPLETE 3+ VIEW COMPARISON:  None. FINDINGS: There is an acute impacted intra-articular fracture of the distal radius. There is some mild dorsal angulation of the distal fracture fragment. There is an acute minimally displaced fracture of the ulnar styloid process. There are advanced degenerative changes of the first carpometacarpal joint. There is osteopenia. IMPRESSION: 1. Acute impacted intra-articular fracture of the distal radius. 2. Acute minimally displaced fracture of the ulnar styloid process. 3. Osteopenia Electronically Signed   By: Constance Holster M.D.   On: 09/29/2018 19:09   Ct Head Wo Contrast  Result Date: 09/29/2018 CLINICAL DATA:  Status post fall, leg deformity EXAM: CT HEAD WITHOUT CONTRAST CT CERVICAL SPINE WITHOUT CONTRAST TECHNIQUE: Multidetector CT imaging of the head and cervical spine was performed following the standard protocol without intravenous contrast. Multiplanar CT image reconstructions of the cervical spine were also generated. COMPARISON:  None. FINDINGS: CT HEAD FINDINGS Brain: No evidence of acute infarction, hemorrhage, extra-axial collection, ventriculomegaly, or mass effect. Generalized cerebral atrophy. Periventricular white matter low attenuation likely secondary to microangiopathy. Vascular: Cerebrovascular atherosclerotic calcifications are noted.  Skull: Negative for fracture or focal lesion. Prior right frontoparietal craniotomy. Sinuses/Orbits: Visualized portions of the orbits are unremarkable. Visualized portions of the paranasal sinuses and mastoid air cells are unremarkable. Other: None. CT CERVICAL SPINE FINDINGS Alignment: Normal. Skull base and vertebrae: No acute compression fracture. Chronic T1 vertebral body compression fracture. No primary bone lesion or focal pathologic process. Soft tissues and spinal canal: No prevertebral fluid or swelling. No visible canal hematoma. Disc levels: Degenerative disc disease with disc height loss at C3-4, C4-5, C5-6, C6-7. broad-based disc osteophyte complex at C3-4 with bilateral foraminal stenosis and bilateral facet arthropathy. Upper chest: Lung apices are clear. Other: No fluid collection or hematoma. IMPRESSION: 1. No acute intracranial pathology. 2.  No acute osseous injury of the cervical spine. 3. Cervical spine spondylosis as described above. Electronically Signed   By: Kathreen Devoid   On: 09/29/2018 18:39   Ct Chest W Contrast  Result Date: 09/29/2018 CLINICAL DATA:  Trauma. EXAM: CT CHEST WITH CONTRAST TECHNIQUE: Multidetector CT imaging of the chest was performed during intravenous contrast administration. CONTRAST:  26mL OMNIPAQUE IOHEXOL 300 MG/ML  SOLN COMPARISON:  X-ray from same day. FINDINGS: Cardiovascular: The heart is significantly enlarged. There are coronary artery calcifications. There is no large centrally located pulmonary embolus. Detection of smaller segmental and subsegmental pulmonary emboli is severely limited. Aortic calcifications are noted. There is no significant pericardial effusion. Mediastinum/Nodes: No enlarged mediastinal, hilar, or axillary lymph nodes. Thyroid gland, trachea, and esophagus demonstrate no significant findings. Lungs/Pleura: There is no pneumothorax. No large pleural effusion. There are scattered areas of atelectasis and scarring bilaterally. Upper  Abdomen: The gallbladder is moderately distended. There is slightly greater than expected density near the pancreatic head, which is only partially visualized. There is a large  hiatal hernia. The remaining portions of the pancreas are essentially unremarkable. Musculoskeletal: There are displaced and nondisplaced fractures involving the sixth through ninth ribs on the left. There is no pneumothorax. There is sclerosis of the T2 vertebral body. IMPRESSION: 1. Multiple acute displaced and nondisplaced left-sided rib fractures as above with no evidence of a left-sided pneumothorax. 2. Sclerosis of the T2 vertebral body likely represents an age-indeterminate compression fracture. Correlation with point tenderness is recommended. 3. Partially visualized density near the pancreatic head, only partially visualized and suboptimally evaluated on this exam. Correlation with an outpatient contrast enhanced CT is recommended for further evaluation of this finding. 4.  Aortic Atherosclerosis (ICD10-I70.0). 5. Large hiatal hernia with the majority of the stomach located within the thoracic cavity. 6. Cardiomegaly. Electronically Signed   By: Constance Holster M.D.   On: 09/29/2018 20:25   Ct Cervical Spine Wo Contrast  Result Date: 09/29/2018 CLINICAL DATA:  Status post fall, leg deformity EXAM: CT HEAD WITHOUT CONTRAST CT CERVICAL SPINE WITHOUT CONTRAST TECHNIQUE: Multidetector CT imaging of the head and cervical spine was performed following the standard protocol without intravenous contrast. Multiplanar CT image reconstructions of the cervical spine were also generated. COMPARISON:  None. FINDINGS: CT HEAD FINDINGS Brain: No evidence of acute infarction, hemorrhage, extra-axial collection, ventriculomegaly, or mass effect. Generalized cerebral atrophy. Periventricular white matter low attenuation likely secondary to microangiopathy. Vascular: Cerebrovascular atherosclerotic calcifications are noted. Skull: Negative for  fracture or focal lesion. Prior right frontoparietal craniotomy. Sinuses/Orbits: Visualized portions of the orbits are unremarkable. Visualized portions of the paranasal sinuses and mastoid air cells are unremarkable. Other: None. CT CERVICAL SPINE FINDINGS Alignment: Normal. Skull base and vertebrae: No acute compression fracture. Chronic T1 vertebral body compression fracture. No primary bone lesion or focal pathologic process. Soft tissues and spinal canal: No prevertebral fluid or swelling. No visible canal hematoma. Disc levels: Degenerative disc disease with disc height loss at C3-4, C4-5, C5-6, C6-7. broad-based disc osteophyte complex at C3-4 with bilateral foraminal stenosis and bilateral facet arthropathy. Upper chest: Lung apices are clear. Other: No fluid collection or hematoma. IMPRESSION: 1. No acute intracranial pathology. 2.  No acute osseous injury of the cervical spine. 3. Cervical spine spondylosis as described above. Electronically Signed   By: Kathreen Devoid   On: 09/29/2018 18:39   Chest Portable 1 View  Result Date: 09/30/2018 CLINICAL DATA:  Atrial fibrillation EXAM: PORTABLE CHEST 1 VIEW COMPARISON:  09/29/2018 FINDINGS: Cardiomegaly. No confluent airspace opacities, effusions or pneumothorax. Previously seen left side rib fractures are better seen on prior studies. Aortic atherosclerosis. IMPRESSION: Mild cardiomegaly.  No acute cardiopulmonary disease. Electronically Signed   By: Rolm Baptise M.D.   On: 09/30/2018 00:00   Dg Hip Unilat W Or Wo Pelvis 2-3 Views Left  Result Date: 09/29/2018 CLINICAL DATA:  Pain status post fall EXAM: DG HIP (WITH OR WITHOUT PELVIS) 2-3V LEFT COMPARISON:  None. FINDINGS: There is an acute comminuted intratrochanteric fracture of the proximal left femur. There is no dislocation. There is diffuse osteopenia. Phleboliths project over the patient's pelvis. There are degenerative changes of the right hip. IMPRESSION: Acute comminuted displaced  intratrochanteric fracture of the left femur. Electronically Signed   By: Constance Holster M.D.   On: 09/29/2018 19:08     ASSESSMENT AND PLAN:   *Atrial fibrillation with rapid ventricular rate-continues to have tachycardic episodes Stat dose of Cardizem and metoprolol given.  Wean off Cardizem drip as tolerated. Anticoagulation stopped for surgery  *  Left intertrochanteric fracture  Plan is for surgery.  Patient will be high risk with her age, atrial fibrillation.  Seen by cardiology.  Off Eliquis.  *  Left distal radial fracture - Splint applied in the emergency room  *  Urinary tract infection - Urine culture pending - IV Rocephin  *  Dementia - Aricept and Namenda continued  DVT and PPI prophylaxis  All the records are reviewed and case discussed with Care Management/Social Worker Management plans discussed with the patient, family and they are in agreement.  CODE STATUS: FULL CODE  TOTAL CC TIME TAKING CARE OF THIS PATIENT: 35 minutes.   POSSIBLE D/C IN 2-3 DAYS, DEPENDING ON CLINICAL CONDITION.  Neita Carp M.D on 09/30/2018 at 11:44 AM  Between 7am to 6pm - Pager - [phone]  After 6pm go to [email or web address] - password [email or web address] [facility]  Office  [phone]  CC: Primary care physician; System, Pcp Not In  Note: This dictation was prepared with Dragon dictation along with smaller phrase technology. Any transcriptional errors that result from this process are unintentional.

## 2018-09-30 NOTE — Anesthesia Preprocedure Evaluation (Signed)
Anesthesia Evaluation  Patient identified by MRN, date of birth, ID band Patient awake    Reviewed: Allergy & Precautions, H&P , NPO status , Patient's Chart, lab work & pertinent test results, reviewed documented beta blocker date and time   Airway Mallampati: II  TM Distance: >3 FB Neck ROM: full    Dental  (+) Teeth Intact   Pulmonary neg pulmonary ROS,    Pulmonary exam normal        Cardiovascular Exercise Tolerance: Poor hypertension, On Medications + CAD, + Past MI and +CHF  negative cardio ROS Normal cardiovascular exam Rhythm:regular Rate:Normal  Ef 30s%. Recent rvr and off cardizem gtt.  Pt with very high risk factors for above procedure.  Cleared by Cardiology as optimized. JA   Neuro/Psych PSYCHIATRIC DISORDERS Anxiety Depression Dementia negative neurological ROS  negative psych ROS   GI/Hepatic negative GI ROS, Neg liver ROS, hiatal hernia, GERD  ,  Endo/Other  negative endocrine ROSHypothyroidism   Renal/GU CRFRenal diseasenegative Renal ROS  negative genitourinary   Musculoskeletal   Abdominal   Peds  Hematology negative hematology ROS (+)   Anesthesia Other Findings Past Medical History: No date: CHF (congestive heart failure) (HCC) No date: Depression No date: GERD (gastroesophageal reflux disease) No date: Hyperlipidemia No date: Hypertension No date: Thyroid disease Past Surgical History: 1980: ABDOMINAL HYSTERECTOMY No date: ANGIOPLASTY     Comment:  due to MI LV ef-40% No date: cataract surgery; Left No date: CRANIOTOMY 02/26/1995: MASTECTOMY; Left     Comment:  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, no chemo or               radiation  BMI    Body Mass Index: 22.18 kg/m     Reproductive/Obstetrics negative OB ROS                             Anesthesia Physical Anesthesia Plan  ASA: IV and emergent  Anesthesia Plan: General ETT   Post-op Pain  Management:    Induction:   PONV Risk Score and Plan:   Airway Management Planned:   Additional Equipment:   Intra-op Plan:   Post-operative Plan:   Informed Consent: I have reviewed the patients History and Physical, chart, labs and discussed the procedure including the risks, benefits and alternatives for the proposed anesthesia with the patient or authorized representative who has indicated his/her understanding and acceptance.     Dental Advisory Given  Plan Discussed with: CRNA  Anesthesia Plan Comments:         Anesthesia Quick Evaluation

## 2018-09-30 NOTE — Anesthesia Procedure Notes (Signed)
Procedure Name: Intubation Performed by: Kelton Pillar, CRNA Pre-anesthesia Checklist: Patient identified, Emergency Drugs available, Suction available and Patient being monitored Patient Re-evaluated:Patient Re-evaluated prior to induction Oxygen Delivery Method: Circle system utilized Preoxygenation: Pre-oxygenation with 100% oxygen Induction Type: IV induction Ventilation: Mask ventilation without difficulty Laryngoscope Size: McGraph and 3 Grade View: Grade I Tube type: Oral Tube size: 6.5 mm Number of attempts: 1 Airway Equipment and Method: Stylet Placement Confirmation: ETT inserted through vocal cords under direct vision,  positive ETCO2,  CO2 detector and breath sounds checked- equal and bilateral Secured at: 21 cm Tube secured with: Tape Dental Injury: Teeth and Oropharynx as per pre-operative assessment

## 2018-09-30 NOTE — H&P (Signed)
H&P reviewed. No significant changes noted.  I discussed patient's findings with the cardiology team.  Given the low risk of significant blood loss, we were in agreement to proceed with surgery today, especially as start of surgery time will be approximately 48 hours after last dose of Eliquis.

## 2018-09-30 NOTE — Progress Notes (Signed)
Initial Nutrition Assessment  RD working remotely.  DOCUMENTATION CODES:   Not applicable  INTERVENTION:  With diet advancement provide Ensure Enlive po BID, each supplement provides 350 kcal and 20 grams of protein.  Provide MVI daily.  NUTRITION DIAGNOSIS:   Increased nutrient needs related to post-op healing as evidenced by estimated needs.  GOAL:   Patient will meet greater than or equal to 90% of their needs  MONITOR:   Diet advancement, PO intake, Supplement acceptance, Labs, Weight trends, Skin, I & O's  REASON FOR ASSESSMENT:   Consult Hip fracture protocol  ASSESSMENT:   85 year old female with PMHx of dementia, CHF, depression, GERD, HLD, HTN admitted with left intertrochanteric fracture, left wrist fracture, A-fib with RVR, UTI.   Attempted to call patient over the phone to obtain nutrition/weight history but she was unable to answer. Per chart she is scheduled to go to OR today for intertrochanteric IM nail and closed reduction of wrist. Patient has been NPO for surgery. Per weight history in chart she appears weight-stable. Patient is currently 55 kg (121.25 lbs).  Medications reviewed and include: Lasix 20 mg BID, levothyroxine, pantoprazole, potassium chloride 10 mEq daily, vitamin C 1000 mg daily, ceftriaxone.  Labs reviewed: BUN 37, Creatinine 1.77.  NUTRITION - FOCUSED PHYSICAL EXAM:  Unable to complete at this time.  Diet Order:   Diet Order            Diet NPO time specified  Diet effective now             EDUCATION NEEDS:   No education needs have been identified at this time  Skin:  Skin Assessment: Reviewed RN Assessment  Last BM:  09/30/2018 per chart  Height:   Ht Readings from Last 1 Encounters:  09/29/18 5\' 2"  (1.575 m)   Weight:   Wt Readings from Last 1 Encounters:  09/29/18 55 kg   Ideal Body Weight:  50 kg  BMI:  Body mass index is 22.18 kg/m.  Estimated Nutritional Needs:   Kcal:  1400-1600  Protein:  70-80  grams  Fluid:  1.4 L/day  Willey Blade, MS, RD, LDN Office: [phone] Pager: [phone] After Hours/Weekend Pager: [phone]

## 2018-09-30 NOTE — TOC Progression Note (Signed)
Transition of Care [facility]) - Progression Note    Patient Details  Name: Holly Burns MRN: [id number] Date of Birth: 1925/01/02  [facility] CM/SW Contact  Holly Burns, Holly Burns Phone Number: [phone]  09/30/2018, 5:14 PM  Clinical Narrative: Clinical Social Worker (CSW) contacted patient's son Holly Burns to complete assessment. Per son he is patient's HPOA and checks on patient daily at her apartment. Per son patient lives alone in an apartment in [place] and [place] in the [facility]. CSW explained to son that after surgery PT will evaluate patient and make a recommendation of SNF or home health. Son prefers SNF. CSW explained that patient will have to go to a [facility] contracted SNF. Son verbalized his understanding. FL2 complete and faxed out. CSW will continue to follow and assist as needed.       Barriers to Discharge: Continued Medical Work up  Expected Discharge Plan and Services                                                 Social Determinants of Health (SDOH) Interventions    Readmission Risk Interventions No flowsheet data found.

## 2018-09-30 NOTE — OR Nursing (Signed)
Gardiner Barefoot notified that patient is awaiting transfer to telemetry floor.  Heart rate 102-120.  02 sat 92-93% on 3L [place].  No orders received at this time.  Will notify her if patient has sustained heart rate in 120's.

## 2018-09-30 NOTE — Consult Note (Signed)
[facility]  CARDIOLOGY CONSULT NOTE  Patient ID: Holly Burns MRN: [id number] DOB/AGE: 83/24/1926 83 y.o.  Admit date: 09/29/2018 Referring Physician Leim Fabry, MD Primary Physician unknown Primary Cardiologist Paraschos Reason for Consultation Atrial fibrillation with RVR, POC for hip surgery  HPI: 85 year old female referred for evaluation of atrial fibrillation with RVR and preoperative cardiovascular evaluation prior to left hip surgery. The patient has short term memory loss with baseline dementia, and is unable to provide much history. The patient has a history of coronary artery disease, status post anterior STEMI with PTCA in LAD in 2002, ischemic cardiomyopathy with LVEF 35-40%, paroxysmal atrial fibrillation on Eliquis with last dose on 6/16 at 5 PM, hypertension, chronic systolic CHF, hyperlipidemia. The patient fractured her left hip and left distal radius after sustaining a fall.  In the ER, the patient was noted to be in atrial fibrillation with RVR, and was started on Cardizem drip. Her rate has since improved to 90-100s. Currently, the patient denies pain.  Review of systems complete and found to be negative unless listed above     Past Medical History:  Diagnosis Date  . CHF (congestive heart failure) ([place])   . Depression   . GERD (gastroesophageal reflux disease)   . Hyperlipidemia   . Hypertension   . Thyroid disease     Past Surgical History:  Procedure Laterality Date  . ABDOMINAL HYSTERECTOMY  1980  . ANGIOPLASTY     due to MI LV ef-40%  . cataract surgery Left   . CRANIOTOMY    . 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 acetaminophen (TYLENOL) 500 MG tablet Take 500-1,000 mg by mouth every 6 (six) hours as needed.    prn at prn  . diltiazem (CARDIZEM CD) 240 MG 24 hr capsule Take 1 capsule (240 mg total) by  mouth daily as needed. 1 capsule 1 09/28/2018 at Unknown time  . donepezil (ARICEPT) 10 MG tablet Take 10 mg by mouth at bedtime.   09/28/2018 at Unknown time  . ELIQUIS 2.5 MG TABS tablet TAKE (1) TABLET TWICE A DAY. 60 tablet 5 09/28/2018 at Unknown time  . escitalopram (LEXAPRO) 10 MG tablet Take 10 mg by mouth 2 (two) times a day.    09/28/2018 at Unknown time  . furosemide (LASIX) 20 MG tablet Take 1 tablet (20 mg total) by mouth 2 (two) times daily. 180 tablet 3 09/28/2018 at Unknown time  . levothyroxine (SYNTHROID) 75 MCG tablet Take 1 tablet (75 mcg total) by mouth daily before breakfast. 90 tablet 3 09/28/2018 at Unknown time  . LORazepam (ATIVAN) 1 MG tablet Take 1 tablet (1 mg total) by mouth every 4 (four) hours as needed for anxiety. (Patient taking differently: Take 0.5-1 mg by mouth every 4 (four) hours as needed for anxiety. ) 180 tablet 1 prn at prn  . memantine (NAMENDA) 10 MG tablet Take 10 mg by mouth 2 (two) times daily.    09/28/2018 at Unknown time  . metoprolol succinate (TOPROL-XL) 25 MG 24 hr tablet Take 1 tablet (25 mg total) by mouth daily. 90 tablet 3 09/28/2018 at Unknown time  . omeprazole (PRILOSEC) 20 MG capsule Take 20 mg by mouth daily.    09/28/2018 at Unknown time  . OXYGEN 2 L.   unknown at unknown  . potassium chloride (MICRO-K) 10 MEQ CR  capsule Take 1 capsule (10 mEq total) by mouth daily. 90 capsule 3 09/28/2018 at Unknown time  . ramipril (ALTACE) 5 MG capsule Take 1 capsule (5 mg total) by mouth daily. 60 capsule 1 09/28/2018 at Unknown time  . simvastatin (ZOCOR) 20 MG tablet Take 1 tablet (20 mg total) by mouth at bedtime. 90 tablet 3 09/28/2018 at Unknown time  . vitamin C (ASCORBIC ACID) 500 MG tablet Take 1,000 mg by mouth daily.    09/28/2018 at Unknown time   Social History   Socioeconomic History  . Marital status: Widowed    Spouse name: Not on file  . Number of children: 2  . Years of education: H/S  . Highest education level: Not on file   Occupational History  . Occupation: Retired  Scientific laboratory technician  . Financial resource strain: Not on file  . Food insecurity    Worry: Not on file    Inability: Not on file  . Transportation needs    Medical: Not on file    Non-medical: Not on file  Tobacco Use  . Smoking status: Never Smoker  . Smokeless tobacco: Never Used  Substance and Sexual Activity  . Alcohol use: No  . Drug use: No  . Sexual activity: Not on file  Lifestyle  . Physical activity    Days per week: Not on file    Minutes per session: Not on file  . Stress: Not on file  Relationships  . Social Herbalist on phone: Not on file    Gets together: Not on file    Attends religious service: Not on file    Active member of club or organization: Not on file    Attends meetings of clubs or organizations: Not on file    Relationship status: Not on file  . Intimate partner violence    Fear of current or ex partner: Not on file    Emotionally abused: Not on file    Physically abused: Not on file    Forced sexual activity: Not on file  Other Topics Concern  . Not on file  Social History Narrative  . Not on file    Family History  Problem Relation Age of Onset  . Hypertension Mother   . Cerebrovascular Accident Mother   . Heart disease Father       Review of systems complete and found to be negative unless listed above      PHYSICAL EXAM  General: Well developed, well nourished, frail, elderly female lying in bed in no acute distress HEENT:  Normocephalic and atramatic Neck:  No JVD.  Lungs: normal effort of breathing on room air, no audible wheezing Heart: irregularly irregular Abdomen: nondistended Msk:  Splint on left wrist Extremities: No clubbing, cyanosis or edema.   Neuro: Alert, not oriented Psych:  Good affect, confused  Labs:   Lab Results  Component Value Date   WBC 21.0 (H) 09/30/2018   HGB 10.2 (L) 09/30/2018   HCT 31.1 (L) 09/30/2018   MCV 95.7 09/30/2018   PLT 281  09/30/2018    Recent Labs  Lab 09/29/18 1903 09/30/18 0434  NA 142 144  K 3.8 4.3  CL 104 105  CO2 21* 25  BUN 30* 37*  CREATININE 1.33* 1.77*  CALCIUM 9.0 8.8*  PROT 6.8  --   BILITOT 1.7*  --   ALKPHOS 42  --   ALT 17  --   AST 32  --   GLUCOSE  280* 179*   Lab Results  Component Value Date   TROPONINI <0.03 09/29/2018    Lab Results  Component Value Date   CHOL 178 11/27/2014   CHOL 176 11/18/2013   Lab Results  Component Value Date   HDL 44 11/27/2014   HDL 47 11/18/2013   Lab Results  Component Value Date   LDLCALC 77 11/27/2014   LDLCALC 92 11/18/2013   Lab Results  Component Value Date   TRIG 283 (H) 11/27/2014   TRIG 186 (A) 11/18/2013   Lab Results  Component Value Date   CHOLHDL 4.0 11/27/2014   No results found for: LDLDIRECT    Radiology: Dg Chest 1 View  Result Date: 09/29/2018 CLINICAL DATA:  Pain status post fall. EXAM: CHEST  1 VIEW COMPARISON:  03/29/2015 FINDINGS: Heart size is enlarged. There are aortic calcifications. Again noted are findings suspicious for a hiatal hernia. There is no acute osseous abnormality. Surgical clips are noted along the patient's left axilla. No large pleural effusion. No focal infiltrate. There are minimally displaced fractures involving the 6 through eighth ribs on the left. There is a deep sulcus sign on the left. IMPRESSION: 1. Acute minimally displaced fractures involving the sixth through eighth ribs on the left. There is a deep sulcus on the left concerning for left-sided pneumothorax. Consider upright radiograph for further evaluation of this finding or a follow-up CT. 2. Mild cardiomegaly with aortic calcifications. These results were called by telephone at the time of interpretation on 09/29/2018 at 7:04 pm to [facility] , who verbally acknowledged these results. Electronically Signed   By: Constance Holster M.D.   On: 09/29/2018 19:07   Dg Wrist Complete Left  Result Date: 09/29/2018 CLINICAL  DATA:  Pain status post fall EXAM: LEFT WRIST - COMPLETE 3+ VIEW COMPARISON:  None. FINDINGS: There is an acute impacted intra-articular fracture of the distal radius. There is some mild dorsal angulation of the distal fracture fragment. There is an acute minimally displaced fracture of the ulnar styloid process. There are advanced degenerative changes of the first carpometacarpal joint. There is osteopenia. IMPRESSION: 1. Acute impacted intra-articular fracture of the distal radius. 2. Acute minimally displaced fracture of the ulnar styloid process. 3. Osteopenia Electronically Signed   By: Constance Holster M.D.   On: 09/29/2018 19:09   Ct Head Wo Contrast  Result Date: 09/29/2018 CLINICAL DATA:  Status post fall, leg deformity EXAM: CT HEAD WITHOUT CONTRAST CT CERVICAL SPINE WITHOUT CONTRAST TECHNIQUE: Multidetector CT imaging of the head and cervical spine was performed following the standard protocol without intravenous contrast. Multiplanar CT image reconstructions of the cervical spine were also generated. COMPARISON:  None. FINDINGS: CT HEAD FINDINGS Brain: No evidence of acute infarction, hemorrhage, extra-axial collection, ventriculomegaly, or mass effect. Generalized cerebral atrophy. Periventricular white matter low attenuation likely secondary to microangiopathy. Vascular: Cerebrovascular atherosclerotic calcifications are noted. Skull: Negative for fracture or focal lesion. Prior right frontoparietal craniotomy. Sinuses/Orbits: Visualized portions of the orbits are unremarkable. Visualized portions of the paranasal sinuses and mastoid air cells are unremarkable. Other: None. CT CERVICAL SPINE FINDINGS Alignment: Normal. Skull base and vertebrae: No acute compression fracture. Chronic T1 vertebral body compression fracture. No primary bone lesion or focal pathologic process. Soft tissues and spinal canal: No prevertebral fluid or swelling. No visible canal hematoma. Disc levels: Degenerative disc  disease with disc height loss at C3-4, C4-5, C5-6, C6-7. broad-based disc osteophyte complex at C3-4 with bilateral foraminal stenosis and bilateral facet arthropathy. Upper chest: Lung  apices are clear. Other: No fluid collection or hematoma. IMPRESSION: 1. No acute intracranial pathology. 2.  No acute osseous injury of the cervical spine. 3. Cervical spine spondylosis as described above. Electronically Signed   By: Kathreen Devoid   On: 09/29/2018 18:39   Ct Chest W Contrast  Result Date: 09/29/2018 CLINICAL DATA:  Trauma. EXAM: CT CHEST WITH CONTRAST TECHNIQUE: Multidetector CT imaging of the chest was performed during intravenous contrast administration. CONTRAST:  36mL OMNIPAQUE IOHEXOL 300 MG/ML  SOLN COMPARISON:  X-ray from same day. FINDINGS: Cardiovascular: The heart is significantly enlarged. There are coronary artery calcifications. There is no large centrally located pulmonary embolus. Detection of smaller segmental and subsegmental pulmonary emboli is severely limited. Aortic calcifications are noted. There is no significant pericardial effusion. Mediastinum/Nodes: No enlarged mediastinal, hilar, or axillary lymph nodes. Thyroid gland, trachea, and esophagus demonstrate no significant findings. Lungs/Pleura: There is no pneumothorax. No large pleural effusion. There are scattered areas of atelectasis and scarring bilaterally. Upper Abdomen: The gallbladder is moderately distended. There is slightly greater than expected density near the pancreatic head, which is only partially visualized. There is a large hiatal hernia. The remaining portions of the pancreas are essentially unremarkable. Musculoskeletal: There are displaced and nondisplaced fractures involving the sixth through ninth ribs on the left. There is no pneumothorax. There is sclerosis of the T2 vertebral body. IMPRESSION: 1. Multiple acute displaced and nondisplaced left-sided rib fractures as above with no evidence of a left-sided  pneumothorax. 2. Sclerosis of the T2 vertebral body likely represents an age-indeterminate compression fracture. Correlation with point tenderness is recommended. 3. Partially visualized density near the pancreatic head, only partially visualized and suboptimally evaluated on this exam. Correlation with an outpatient contrast enhanced CT is recommended for further evaluation of this finding. 4.  Aortic Atherosclerosis (ICD10-I70.0). 5. Large hiatal hernia with the majority of the stomach located within the thoracic cavity. 6. Cardiomegaly. Electronically Signed   By: Constance Holster M.D.   On: 09/29/2018 20:25   Ct Cervical Spine Wo Contrast  Result Date: 09/29/2018 CLINICAL DATA:  Status post fall, leg deformity EXAM: CT HEAD WITHOUT CONTRAST CT CERVICAL SPINE WITHOUT CONTRAST TECHNIQUE: Multidetector CT imaging of the head and cervical spine was performed following the standard protocol without intravenous contrast. Multiplanar CT image reconstructions of the cervical spine were also generated. COMPARISON:  None. FINDINGS: CT HEAD FINDINGS Brain: No evidence of acute infarction, hemorrhage, extra-axial collection, ventriculomegaly, or mass effect. Generalized cerebral atrophy. Periventricular white matter low attenuation likely secondary to microangiopathy. Vascular: Cerebrovascular atherosclerotic calcifications are noted. Skull: Negative for fracture or focal lesion. Prior right frontoparietal craniotomy. Sinuses/Orbits: Visualized portions of the orbits are unremarkable. Visualized portions of the paranasal sinuses and mastoid air cells are unremarkable. Other: None. CT CERVICAL SPINE FINDINGS Alignment: Normal. Skull base and vertebrae: No acute compression fracture. Chronic T1 vertebral body compression fracture. No primary bone lesion or focal pathologic process. Soft tissues and spinal canal: No prevertebral fluid or swelling. No visible canal hematoma. Disc levels: Degenerative disc disease with  disc height loss at C3-4, C4-5, C5-6, C6-7. broad-based disc osteophyte complex at C3-4 with bilateral foraminal stenosis and bilateral facet arthropathy. Upper chest: Lung apices are clear. Other: No fluid collection or hematoma. IMPRESSION: 1. No acute intracranial pathology. 2.  No acute osseous injury of the cervical spine. 3. Cervical spine spondylosis as described above. Electronically Signed   By: Kathreen Devoid   On: 09/29/2018 18:39   Chest Portable 1 View  Result Date:  09/30/2018 CLINICAL DATA:  Atrial fibrillation EXAM: PORTABLE CHEST 1 VIEW COMPARISON:  09/29/2018 FINDINGS: Cardiomegaly. No confluent airspace opacities, effusions or pneumothorax. Previously seen left side rib fractures are better seen on prior studies. Aortic atherosclerosis. IMPRESSION: Mild cardiomegaly.  No acute cardiopulmonary disease. Electronically Signed   By: Rolm Baptise M.D.   On: 09/30/2018 00:00   Dg Hip Unilat W Or Wo Pelvis 2-3 Views Left  Result Date: 09/29/2018 CLINICAL DATA:  Pain status post fall EXAM: DG HIP (WITH OR WITHOUT PELVIS) 2-3V LEFT COMPARISON:  None. FINDINGS: There is an acute comminuted intratrochanteric fracture of the proximal left femur. There is no dislocation. There is diffuse osteopenia. Phleboliths project over the patient's pelvis. There are degenerative changes of the right hip. IMPRESSION: Acute comminuted displaced intratrochanteric fracture of the left femur. Electronically Signed   By: Constance Holster M.D.   On: 09/29/2018 19:08    EKG: atrial fibrillation, 104 bpm  ASSESSMENT AND PLAN:  85 year old frail female with history of CAD, status post anterior STEMI with PTCA of LAD in 2002, ischemic cardiomyopathy with LVEF 35-40%, paroxysmal atrial fibrillation, hypertension, chronic systolic CHF, and dementia, who has thus far been medically optimized, who recently sustained a fall, subsequently fracturing her left hip and left wrist, and presented to [facility] ER in rapid atrial  fibrillation, started on Cardizem drip with improvement of rate, and now requiring preoperative cardiovascular evaluation prior to surgery. The patient is considered to be at moderate risk for serious cardiovascular complications and bleeding due to her ischemic cardiomyopathy, history of MI and CHF, advanced age, frailty, and recent anticoagulation use.  Plan: 1. Discontinue Cardizem drip  2. Switch to PO Cardizem CD 240 mg daily 3. Continue to hold Eliquis, and resume as soon as safely possible postoperatively 4. Continue metoprolol 50 mg pre-, peri-, and postoperatively 5. Proceed with surgery if deemed medically necessary. Would consider surgery after 48 hours of being off of Eliquis to reduce bleeding risk. 6. Avoid overhydrating due to patient's known CHF 7. No further cardiac diagnostics recommended at this time.  Sign off for now; call with any questions. Signed: Clabe Seal PA-C 09/30/2018, 8:21 AM  The patient encounter and history were discussed with Dr. Saralyn Pilar who agreed with the plan.

## 2018-09-30 NOTE — Transfer of Care (Signed)
Immediate Anesthesia Transfer of Care Note  Patient: Vinnie Level  Procedure(s) Performed: INTRAMEDULLARY (IM) NAIL INTERTROCHANTRIC (Left Hip) CLOSED REDUCTION WRIST (Left Wrist)  Patient Location: PACU  Anesthesia Type:General  Level of Consciousness: awake, alert  and sedated  Airway & Oxygen Therapy: Patient Spontanous Breathing and Patient connected to face mask oxygen  Post-op Assessment: Report given to RN and Post -op Vital signs reviewed and stable  Post vital signs: Reviewed and stable  Last Vitals:  Vitals Value Taken Time  BP    Temp    Pulse 109 09/30/18 1732  Resp    SpO2 100 % 09/30/18 1732  Vitals shown include unvalidated device data.  Last Pain:  Vitals:   09/30/18 1529  TempSrc: Temporal  PainSc:       Patients Stated Pain Goal: 0 (04/01/74 8832)  Complications: No apparent anesthesia complications

## 2018-09-30 NOTE — Progress Notes (Signed)
*  PRELIMINARY RESULTS* Echocardiogram 2D Echocardiogram has been performed.  Sherrie Sport 09/30/2018, 9:44 AM

## 2018-09-30 NOTE — Consult Note (Signed)
ORTHOPAEDIC CONSULTATION  REQUESTING PHYSICIAN: Hillary Bow, MD  Chief Complaint:   L hip pain, L distal radius fracture  History of Present Illness: History obtained from discussion with the patient's son, discussion with the emergency department staff, and review of her medical record.  Holly Burns is a 85 y.o. female who had a fall yesterday.  The patient noted immediate left hip pain and left wrist pain and inability to ambulate.  The patient ambulates with a walker at baseline.  She lives independently in an apartment unit with her family nearby.  She does have dementia with significant short-term memory loss.  Pain is described as sharp at its worst and a dull ache at its best.  Pain is rated a 10 out of 10 in severity.  Pain is improved with rest and immobilization.  Pain is worse with any sort of movement.  X-rays in the emergency department show a left intertrochanteric hip fracture and left distal radius fracture.  He was also found to have rib fractures on the left side involving ribs 6-8.  Of note, there is concern that the patient has a UTI and has been started on IV antibiotics for that.  She is also found to have atrial fibrillation with RVR and is on a Cardizem drip.  Her heart rate has been adequately controlled since that time.  She is also on Eliquis for this.  Her last dose of Eliquis was on 09/28/2018 at 5 PM.  She also has a history of chronic kidney disease, MI, and CHF.  Past Medical History:  Diagnosis Date  . CHF (congestive heart failure) ([place])   . Depression   . GERD (gastroesophageal reflux disease)   . Hyperlipidemia   . Hypertension   . Thyroid disease    Past Surgical History:  Procedure Laterality Date  . ABDOMINAL HYSTERECTOMY  1980  . ANGIOPLASTY     due to MI LV ef-40%  . cataract surgery Left   . CRANIOTOMY    . 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 Marital status: Widowed    Spouse name: Not on file  . Number of children: 2  . Years of education: H/S  . Highest education level: Not on file  Occupational History  . Occupation: Retired  Scientific laboratory technician  . Financial resource strain: Not on file  . Food insecurity    Worry: Not on file    Inability: Not on file  . Transportation needs    Medical: Not on file    Non-medical: Not on file  Tobacco Use  . Smoking status: Never Smoker  . Smokeless tobacco: Never Used  Substance and Sexual Activity  . Alcohol use: No  . Drug use: No  . Sexual activity: Not on file  Lifestyle  . Physical activity    Days per week: Not on file    Minutes per session: Not on file  . Stress: Not on file  Relationships  . Social Herbalist on phone: Not on file    Gets together: Not on file    Attends religious service: Not on file    Active member of club or organization: Not on file    Attends meetings of clubs or organizations: Not on file    Relationship status: Not on file  Other Topics Concern  . Not on file  Social  History Narrative  . Not on file   Family History  Problem Relation Age of Onset  . Hypertension Mother   . Cerebrovascular Accident Mother   . Heart disease Father    Allergies  Allergen Reactions  . Codeine Other (See Comments)    Reaction: unknown  . Penicillins Other (See Comments)    Unknown reaction and unable to answer follow-up questions  . Phenytoin Sodium Extended Other (See Comments)    Hyperthermia   Prior to Admission medications   Medication Sig Start Date End Date Taking? Authorizing Provider  acetaminophen (TYLENOL) 500 MG tablet Take 500-1,000 mg by mouth every 6 (six) hours as needed.    Yes [provider]  diltiazem (CARDIZEM CD) 240 MG 24 hr capsule Take 1 capsule (240 mg total) by mouth daily as needed. 04/13/15  Yes Margarita Rana, MD  donepezil  (ARICEPT) 10 MG tablet Take 10 mg by mouth at bedtime.   Yes [provider]  ELIQUIS 2.5 MG TABS tablet TAKE (1) TABLET TWICE A DAY. 05/23/15  Yes Margarita Rana, MD  escitalopram (LEXAPRO) 10 MG tablet Take 10 mg by mouth 2 (two) times a day.    Yes [provider]  furosemide (LASIX) 20 MG tablet Take 1 tablet (20 mg total) by mouth 2 (two) times daily. 01/29/15  Yes Margarita Rana, MD  levothyroxine (SYNTHROID) 75 MCG tablet Take 1 tablet (75 mcg total) by mouth daily before breakfast. 12/05/14  Yes Margarita Rana, MD  LORazepam (ATIVAN) 1 MG tablet Take 1 tablet (1 mg total) by mouth every 4 (four) hours as needed for anxiety. Patient taking differently: Take 0.5-1 mg by mouth every 4 (four) hours as needed for anxiety.  03/03/15  Yes Margarita Rana, MD  memantine (NAMENDA) 10 MG tablet Take 10 mg by mouth 2 (two) times daily.  01/29/11  Yes [provider]  metoprolol succinate (TOPROL-XL) 25 MG 24 hr tablet Take 1 tablet (25 mg total) by mouth daily. 03/03/15  Yes Margarita Rana, MD  omeprazole (PRILOSEC) 20 MG capsule Take 20 mg by mouth daily.    Yes [provider]  OXYGEN 2 L.   Yes [provider]  potassium chloride (MICRO-K) 10 MEQ CR capsule Take 1 capsule (10 mEq total) by mouth daily. 11/07/14  Yes Margarita Rana, MD  ramipril (ALTACE) 5 MG capsule Take 1 capsule (5 mg total) by mouth daily. 04/02/15  Yes Henreitta Leber, MD  simvastatin (ZOCOR) 20 MG tablet Take 1 tablet (20 mg total) by mouth at bedtime. 01/10/15  Yes Margarita Rana, MD  vitamin C (ASCORBIC ACID) 500 MG tablet Take 1,000 mg by mouth daily.    Yes [provider]   Recent Labs    09/29/18 1801 09/29/18 1903 09/30/18 0434  WBC 21.1*  --  21.0*  HGB 11.3*  --  10.2*  HCT 34.6*  --  31.1*  PLT 296  --  281  K  --  3.8 4.3  CL  --  104 105  CO2  --  21* 25  BUN  --  30* 37*  CREATININE  --  1.33* 1.77*  GLUCOSE  --  280* 179*  CALCIUM  --  9.0 8.8*  INR  1.1  --   --    Dg Chest 1 View  Result Date: 09/29/2018 CLINICAL DATA:  Pain status post fall. EXAM: CHEST  1 VIEW COMPARISON:  03/29/2015 FINDINGS: Heart size is enlarged. There are aortic calcifications. Again noted are  findings suspicious for a hiatal hernia. There is no acute osseous abnormality. Surgical clips are noted along the patient's left axilla. No large pleural effusion. No focal infiltrate. There are minimally displaced fractures involving the 6 through eighth ribs on the left. There is a deep sulcus sign on the left. IMPRESSION: 1. Acute minimally displaced fractures involving the sixth through eighth ribs on the left. There is a deep sulcus on the left concerning for left-sided pneumothorax. Consider upright radiograph for further evaluation of this finding or a follow-up CT. 2. Mild cardiomegaly with aortic calcifications. These results were called by telephone at the time of interpretation on 09/29/2018 at 7:04 pm to [facility] , who verbally acknowledged these results. Electronically Signed   By: Constance Holster M.D.   On: 09/29/2018 19:07   Dg Wrist Complete Left  Result Date: 09/29/2018 CLINICAL DATA:  Pain status post fall EXAM: LEFT WRIST - COMPLETE 3+ VIEW COMPARISON:  None. FINDINGS: There is an acute impacted intra-articular fracture of the distal radius. There is some mild dorsal angulation of the distal fracture fragment. There is an acute minimally displaced fracture of the ulnar styloid process. There are advanced degenerative changes of the first carpometacarpal joint. There is osteopenia. IMPRESSION: 1. Acute impacted intra-articular fracture of the distal radius. 2. Acute minimally displaced fracture of the ulnar styloid process. 3. Osteopenia Electronically Signed   By: Constance Holster M.D.   On: 09/29/2018 19:09   Ct Head Wo Contrast  Result Date: 09/29/2018 CLINICAL DATA:  Status post fall, leg deformity EXAM: CT HEAD WITHOUT CONTRAST CT CERVICAL SPINE  WITHOUT CONTRAST TECHNIQUE: Multidetector CT imaging of the head and cervical spine was performed following the standard protocol without intravenous contrast. Multiplanar CT image reconstructions of the cervical spine were also generated. COMPARISON:  None. FINDINGS: CT HEAD FINDINGS Brain: No evidence of acute infarction, hemorrhage, extra-axial collection, ventriculomegaly, or mass effect. Generalized cerebral atrophy. Periventricular white matter low attenuation likely secondary to microangiopathy. Vascular: Cerebrovascular atherosclerotic calcifications are noted. Skull: Negative for fracture or focal lesion. Prior right frontoparietal craniotomy. Sinuses/Orbits: Visualized portions of the orbits are unremarkable. Visualized portions of the paranasal sinuses and mastoid air cells are unremarkable. Other: None. CT CERVICAL SPINE FINDINGS Alignment: Normal. Skull base and vertebrae: No acute compression fracture. Chronic T1 vertebral body compression fracture. No primary bone lesion or focal pathologic process. Soft tissues and spinal canal: No prevertebral fluid or swelling. No visible canal hematoma. Disc levels: Degenerative disc disease with disc height loss at C3-4, C4-5, C5-6, C6-7. broad-based disc osteophyte complex at C3-4 with bilateral foraminal stenosis and bilateral facet arthropathy. Upper chest: Lung apices are clear. Other: No fluid collection or hematoma. IMPRESSION: 1. No acute intracranial pathology. 2.  No acute osseous injury of the cervical spine. 3. Cervical spine spondylosis as described above. Electronically Signed   By: Kathreen Devoid   On: 09/29/2018 18:39   Ct Chest W Contrast  Result Date: 09/29/2018 CLINICAL DATA:  Trauma. EXAM: CT CHEST WITH CONTRAST TECHNIQUE: Multidetector CT imaging of the chest was performed during intravenous contrast administration. CONTRAST:  11mL OMNIPAQUE IOHEXOL 300 MG/ML  SOLN COMPARISON:  X-ray from same day. FINDINGS: Cardiovascular: The heart is  significantly enlarged. There are coronary artery calcifications. There is no large centrally located pulmonary embolus. Detection of smaller segmental and subsegmental pulmonary emboli is severely limited. Aortic calcifications are noted. There is no significant pericardial effusion. Mediastinum/Nodes: No enlarged mediastinal, hilar, or axillary lymph nodes. Thyroid gland, trachea, and esophagus demonstrate no  significant findings. Lungs/Pleura: There is no pneumothorax. No large pleural effusion. There are scattered areas of atelectasis and scarring bilaterally. Upper Abdomen: The gallbladder is moderately distended. There is slightly greater than expected density near the pancreatic head, which is only partially visualized. There is a large hiatal hernia. The remaining portions of the pancreas are essentially unremarkable. Musculoskeletal: There are displaced and nondisplaced fractures involving the sixth through ninth ribs on the left. There is no pneumothorax. There is sclerosis of the T2 vertebral body. IMPRESSION: 1. Multiple acute displaced and nondisplaced left-sided rib fractures as above with no evidence of a left-sided pneumothorax. 2. Sclerosis of the T2 vertebral body likely represents an age-indeterminate compression fracture. Correlation with point tenderness is recommended. 3. Partially visualized density near the pancreatic head, only partially visualized and suboptimally evaluated on this exam. Correlation with an outpatient contrast enhanced CT is recommended for further evaluation of this finding. 4.  Aortic Atherosclerosis (ICD10-I70.0). 5. Large hiatal hernia with the majority of the stomach located within the thoracic cavity. 6. Cardiomegaly. Electronically Signed   By: Constance Holster M.D.   On: 09/29/2018 20:25   Ct Cervical Spine Wo Contrast  Result Date: 09/29/2018 CLINICAL DATA:  Status post fall, leg deformity EXAM: CT HEAD WITHOUT CONTRAST CT CERVICAL SPINE WITHOUT CONTRAST  TECHNIQUE: Multidetector CT imaging of the head and cervical spine was performed following the standard protocol without intravenous contrast. Multiplanar CT image reconstructions of the cervical spine were also generated. COMPARISON:  None. FINDINGS: CT HEAD FINDINGS Brain: No evidence of acute infarction, hemorrhage, extra-axial collection, ventriculomegaly, or mass effect. Generalized cerebral atrophy. Periventricular white matter low attenuation likely secondary to microangiopathy. Vascular: Cerebrovascular atherosclerotic calcifications are noted. Skull: Negative for fracture or focal lesion. Prior right frontoparietal craniotomy. Sinuses/Orbits: Visualized portions of the orbits are unremarkable. Visualized portions of the paranasal sinuses and mastoid air cells are unremarkable. Other: None. CT CERVICAL SPINE FINDINGS Alignment: Normal. Skull base and vertebrae: No acute compression fracture. Chronic T1 vertebral body compression fracture. No primary bone lesion or focal pathologic process. Soft tissues and spinal canal: No prevertebral fluid or swelling. No visible canal hematoma. Disc levels: Degenerative disc disease with disc height loss at C3-4, C4-5, C5-6, C6-7. broad-based disc osteophyte complex at C3-4 with bilateral foraminal stenosis and bilateral facet arthropathy. Upper chest: Lung apices are clear. Other: No fluid collection or hematoma. IMPRESSION: 1. No acute intracranial pathology. 2.  No acute osseous injury of the cervical spine. 3. Cervical spine spondylosis as described above. Electronically Signed   By: Kathreen Devoid   On: 09/29/2018 18:39   Chest Portable 1 View  Result Date: 09/30/2018 CLINICAL DATA:  Atrial fibrillation EXAM: PORTABLE CHEST 1 VIEW COMPARISON:  09/29/2018 FINDINGS: Cardiomegaly. No confluent airspace opacities, effusions or pneumothorax. Previously seen left side rib fractures are better seen on prior studies. Aortic atherosclerosis. IMPRESSION: Mild cardiomegaly.   No acute cardiopulmonary disease. Electronically Signed   By: Rolm Baptise M.D.   On: 09/30/2018 00:00   Dg Hip Unilat W Or Wo Pelvis 2-3 Views Left  Result Date: 09/29/2018 CLINICAL DATA:  Pain status post fall EXAM: DG HIP (WITH OR WITHOUT PELVIS) 2-3V LEFT COMPARISON:  None. FINDINGS: There is an acute comminuted intratrochanteric fracture of the proximal left femur. There is no dislocation. There is diffuse osteopenia. Phleboliths project over the patient's pelvis. There are degenerative changes of the right hip. IMPRESSION: Acute comminuted displaced intratrochanteric fracture of the left femur. Electronically Signed   By: Jamie Kato.D.  On: 09/29/2018 19:08     Positive ROS: All other systems have been reviewed and were otherwise negative with the exception of those mentioned in the HPI and as above.  Physical Exam: BP (!) 123/41 (BP Location: Right Arm)   Pulse 93   Temp 97.8 F (36.6 C) (Oral)   Resp 18   Ht 5\' 2"  (1.575 m)   Wt 55 kg   SpO2 94%   BMI 22.18 kg/m  General:  Alert, no acute distress Psychiatric:  Patient is competent for consent with normal mood and affect   Cardiovascular:  No pedal edema, regular rate and rhythm Respiratory:  No wheezing, non-labored breathing GI:  Abdomen is soft and non-tender Skin:  No lesions in the area of chief complaint, no erythema Neurologic:  Sensation intact distally, CN grossly intact Lymphatic:  No axillary or cervical lymphadenopathy  Orthopedic Exam:  LLE: + DF/PF/EHL SILT grossly over foot Foot wwp +Log roll/axial load  LUE: Able to wiggle fingers Positive sensation over distal fingertips Fingers warm and well-perfused. Splint in place   X-rays:  As above: L unstable intertrochanteric hip fracture, left distal radius fracture with shortening and dorsal tilt, rib fractures on the left side 6-8  Assessment/Plan: BRITINY DEFRAIN is a 85 y.o. female with a L intertrochanteric hip fracture and distal  radius fracture with associated rib fractures.   1. I discussed the various treatment options including both surgical and non-surgical management of her fracture with the patient's family (medical PoA). We discussed the high risk of perioperative complications due to patient's age, dementia, and other co-morbidities. After discussion of risks, benefits, and alternatives to surgery, the family and patient were in agreement to proceed with surgery. The goals of surgery would be to provide adequate pain relief and allow for mobilization. Plan for surgery is l hip cephalomedullary nailing with closed reduction of left wrist today, 09/30/18. 2. NPO until OR 3. Hold anticoagulation in advance of OR 4.  Recommend cardiology consult for preoperative optimization due to A. fib with RVR now managed with Cardizem drip.       Leim Fabry   09/30/2018 8:00 AM

## 2018-10-01 ENCOUNTER — Encounter: Payer: Self-pay | Admitting: Orthopedic Surgery

## 2018-10-01 LAB — CBC
HCT: 24.3 % — ABNORMAL LOW (ref 36.0–46.0)
Hemoglobin: 8 g/dL — ABNORMAL LOW (ref 12.0–15.0)
MCH: 32.3 pg (ref 26.0–34.0)
MCHC: 32.9 g/dL (ref 30.0–36.0)
MCV: 98 fL (ref 80.0–100.0)
Platelets: 257 10*3/uL (ref 150–400)
RBC: 2.48 MIL/uL — ABNORMAL LOW (ref 3.87–5.11)
RDW: 15.4 % (ref 11.5–15.5)
WBC: 20.6 10*3/uL — ABNORMAL HIGH (ref 4.0–10.5)
nRBC: 0 % (ref 0.0–0.2)

## 2018-10-01 LAB — BASIC METABOLIC PANEL
Anion gap: 12 (ref 5–15)
BUN: 58 mg/dL — ABNORMAL HIGH (ref 8–23)
CO2: 22 mmol/L (ref 22–32)
Calcium: 8 mg/dL — ABNORMAL LOW (ref 8.9–10.3)
Chloride: 105 mmol/L (ref 98–111)
Creatinine, Ser: 3.17 mg/dL — ABNORMAL HIGH (ref 0.44–1.00)
GFR calc Af Amer: 14 mL/min — ABNORMAL LOW (ref >60)
GFR calc non Af Amer: 12 mL/min — ABNORMAL LOW (ref >60)
Glucose, Bld: 175 mg/dL — ABNORMAL HIGH (ref 70–99)
Potassium: 4 mmol/L (ref 3.5–5.1)
Sodium: 139 mmol/L (ref 135–145)

## 2018-10-01 MED ORDER — FE FUMARATE-B12-VIT C-FA-IFC PO CAPS
1.0000 | ORAL_CAPSULE | Freq: Two times a day (BID) | ORAL | Status: DC
  Administered 2018-10-01 – 2018-10-04 (×7): 1 via ORAL
  Filled 2018-10-01 (×7): qty 1

## 2018-10-01 NOTE — Evaluation (Signed)
Occupational Therapy Evaluation Patient Details Name: Holly Burns MRN: [id number] DOB: [birth year] Today's Date: 10/01/2018    History of Present Illness 85 year old female with PMHx of dementia, CHF, depression, GERD, HLD, HTN admitted with left intertrochanteric fracture, left wrist fracture, A-fib with RVR, UTI. S/p intertrochanteric IM nail LLE and closed reduction L wrist.   Clinical Impression   Ms. Lacasse was seen for OT evaluation this date post-op day 1 from above sx. Pt is a PACE participant who, prior to hospital admission, required assistance from [facility] staff for ADLs such as bathing and dressing. Pt was oriented to self only at time of OT evaluation and was unable to provide a history on this date. This therapist contacted pt's son who provided information on her home set-up and PLOF.  Pt lives alone with staff who visit 4 days per week for about an hour and a half to support with basic care. Pt's son also lives nearby and he checks on his mother regularly. Per son, pt cognitive impairments at baseline primarily impact her memory and ability to function outside of her typical routine. She can recognize family members, but would have difficulty relating what she had done in a day. Pt on 2L of O2 in the home. Currently pt demonstrates impairments in cognition, orientation, activity tolerance, functional LUE and LLE use as well as generalized weakness throughout BUE/BLE. Pt SPo2 at 80 while supine in bed on 2L. RN notified. OT provided education on PLB and increased O2 at RN request. Pt O2 improving to 92-95 with increase to 5L and prompts to PLB. Currently, pt requiring max/total assist for bed-level ADLs at time of evaluation. Pt would benefit from skilled OT to address noted impairments and functional limitations (see below for any additional details) in order to maximize safety and independence while minimizing falls risk and caregiver burden.  Upon hospital discharge, recommend pt  discharge to [place].     Follow Up Recommendations  SNF;Supervision/Assistance - 24 hour    Equipment Recommendations  Other (comment)(TBD at next venue of care.)    Recommendations for Other Services       Precautions / Restrictions Precautions Precautions: Fall Restrictions Weight Bearing Restrictions: Yes LUE Weight Bearing: Non weight bearing LLE Weight Bearing: Weight bearing as tolerated      Mobility Bed Mobility Overal bed mobility: Needs Assistance Bed Mobility: Rolling Rolling: Max assist         General bed mobility comments: Pt with limited engagement in bed mobility on this date. Stated "that hurts!" when OT attempted to assist with re-positioning in bed.  Transfers                 General transfer comment: Deferred.    Balance                                           ADL either performed or assessed with clinical judgement   ADL Overall ADL's : Needs assistance/impaired                                       General ADL Comments: Per pt son, pt requires physical asssit for bathing and dressing at baseline, but is able to participate and complete at least some part of the ADL task herself. . At  this time, pt at max/total assist for ADLs overall due to decreased cognition and limitations in LLE/LUE. Pt has not yet ambulated with PT, but uses a RW for functional mobility at baseline.     Vision Baseline Vision/History: Wears glasses Wears Glasses: At all times Additional Comments: Pt stated she "cant say" when asked about vision at time of evaluation. Per pt son, she wears glasses at all times.     Perception     Praxis      Pertinent Vitals/Pain Pain Assessment: Faces Faces Pain Scale: Hurts little more Pain Descriptors / Indicators: Grimacing;Guarding Pain Intervention(s): Limited activity within patient's tolerance;Monitored during session;Repositioned     Hand Dominance Right   Extremity/Trunk  Assessment Upper Extremity Assessment Upper Extremity Assessment: Generalized weakness;LUE deficits/detail LUE Deficits / Details: s/p closed fixation L wrist. Pt unable to lift L arm. LUE: Unable to fully assess due to immobilization;Unable to fully assess due to pain LUE Coordination: decreased gross motor;decreased fine motor   Lower Extremity Assessment Lower Extremity Assessment: Defer to PT evaluation;Generalized weakness       Communication     Cognition Arousal/Alertness: Awake/alert   Overall Cognitive Status: History of cognitive impairments - at baseline Area of Impairment: Orientation;Following commands                 Orientation Level: Person     Following Commands: Follows one step commands inconsistently           General Comments  Pt noted to have SPo2 of 80 while supine in bed on 3L. RN notified and requested OT increase O2 until pt up to 92. OT did as requested and provided pt eduatio non PLB which pt return demonstrated with multimodal cueing. Pt SPO2 increasing to 95 with 5L and prompts for PLB. Pt noted to breath primarily through her mouth when not prompted to PLB. RN notified of pt O2 sats again at end of session.    Exercises Other Exercises Other Exercises: Pt educated on PLB as ECS. OT also assisted pt with repositioning in bed as pt was noted to be leaning to her R side.   Shoulder Instructions      Home Living Family/patient expects to be discharged to:: Private residence Living Arrangements: Alone Available Help at Discharge: Family;Other (Comment)(PACE participant) Type of Home: Apartment Home Access: Level entry     Home Layout: One level     Bathroom Shower/Tub: Teacher, early years/pre: Standard Bathroom Accessibility: Yes How Accessible: Accessible via walker Home Equipment: Grab bars - tub/shower;Grab bars - toilet;Shower seat;Walker - standard          Prior Functioning/Environment Level of Independence:  Needs assistance  Gait / Transfers Assistance Needed: Uses walker for household distances ADL's / Homemaking Assistance Needed: Requires assistance from PACE Communication / Swallowing Assistance Needed: Cognitive impairment at baseline Comments: Assist for bathing, dressing, meal prep. Unsafe to do independently.        OT Problem List: Decreased strength;Decreased coordination;Cardiopulmonary status limiting activity;Decreased range of motion;Decreased cognition;Decreased activity tolerance;Decreased safety awareness;Impaired UE functional use;Decreased knowledge of precautions;Pain      OT Treatment/Interventions: Self-care/ADL training;Therapeutic exercise;Therapeutic activities;Energy conservation;Cognitive remediation/compensation;DME and/or AE instruction;Patient/family education    OT Goals(Current goals can be found in the care plan section) Acute Rehab OT Goals OT Goal Formulation: Patient unable to participate in goal setting ADL Goals Pt Will Perform Grooming: sitting;with min assist;with adaptive equipment(With LRAD for improved safety and functional independence.) Pt Will Perform Upper Body Dressing: with  set-up;sitting;with min assist;with adaptive equipment(With LRAD for improved safety and functional independence.) Pt Will Transfer to Toilet: stand pivot transfer;bedside commode;with mod assist(With LRAD for improved safety and functional independence.) Additional ADL Goal #1: With verbal cueing and modeling as needed, pt will utilized PLB strategies for improved stamina and energy conservation during meaningful occupations of daily life upon hospital DC.  OT Frequency: Min 2X/week   Barriers to D/C: Decreased caregiver support  Pt lives alone.       Co-evaluation              AM-PAC OT "6 Clicks" Daily Activity     Outcome Measure Help from another person eating meals?: A Little Help from another person taking care of personal grooming?: A Lot Help from  another person toileting, which includes using toliet, bedpan, or urinal?: A Lot Help from another person bathing (including washing, rinsing, drying)?: A Lot Help from another person to put on and taking off regular upper body clothing?: A Lot Help from another person to put on and taking off regular lower body clothing?: A Lot 6 Click Score: 13   End of Session Equipment Utilized During Treatment: Oxygen Nurse Communication: Other (comment)(Pt SPo2 sats.)  Activity Tolerance: Patient limited by fatigue;Patient limited by pain Patient left: in bed;with call bell/phone within reach;with bed alarm set  OT Visit Diagnosis: History of falling (Z91.81);Other abnormalities of gait and mobility (R26.89);Pain Pain - Right/Left: Left Pain - part of body: Arm;Hip                Time: 0865-7846 OT Time Calculation (min): 18 min Charges:  OT General Charges $OT Visit: 1 Visit OT Evaluation $OT Eval Moderate Complexity: 1 Mod OT Treatments $Self Care/Home Management : 8-22 mins  Shara Blazing, M.S., OTR/L Ascom: [phone] 10/01/18, 1:44 PM

## 2018-10-01 NOTE — Progress Notes (Signed)
Called son Blimy Napoleon multiple times.

## 2018-10-01 NOTE — NC FL2 (Addendum)
[place] LEVEL OF CARE SCREENING TOOL     IDENTIFICATION  Patient Name: Holly Burns Birthdate: 1925-01-26 Sex: female Admission Date (Current Location): 09/29/2018  [place] and [place] Number:  Engineering geologist and Address:  [facility] [address], [place], [place] [address]      Provider Number: [id number]  Attending Physician Name and Address:  Hillary Bow, MD  Relative Name and Phone Number:  Brytni, Dray [phone]    Current Level of Care: Hospital Recommended Level of Care: [place] Prior Approval Number:    Date Approved/Denied:   PASRR Number: [id number]  Discharge Plan: SNF    Current Diagnoses: Patient Active Problem List   Diagnosis Date Noted  . Intertrochanteric fracture, closed, left, initial encounter ([place]) 09/29/2018  . Fall 09/29/2018  . Atrial fibrillation with RVR ([place]) 03/30/2015  . Adult idiopathic generalized osteoporosis 03/12/2015  . Anxiety 10/13/2014  . B12 deficiency 10/13/2014  . Breast CA ([place]) 10/13/2014  . Atherosclerosis of coronary artery 10/13/2014  . Chronic kidney disease (CKD), stage III (moderate) ([place]) 10/13/2014  . CCF (congestive cardiac failure) ([place]) 10/13/2014  . Clinical depression 10/13/2014  . DD (diverticular disease) 10/13/2014  . Bleeding gastrointestinal 10/13/2014  . Acid reflux 10/13/2014  . 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 Hypercholesteremia 10/13/2014  . BP (high blood pressure) 10/13/2014  . Decreased potassium in the blood 10/13/2014  . Adult hypothyroidism 10/13/2014  . Hypoxia 10/13/2014  . OP (osteoporosis) 10/13/2014  . Dementia ([place]) 10/13/2014  . Avitaminosis D 10/13/2014  . Involutional osteoporosis 01/02/2014  . Heart attack ([place]) 09/12/2013  . H/O cardiac catheterization 09/12/2013  . Brain tumor ([place]) 09/12/2013    Orientation RESPIRATION BLADDER Height & Weight     Self, Place  Normal  Continent Weight: (no admission weight. low bed just had sugery . per nurse) Height:  5\' 2"  (157.5 cm)  BEHAVIORAL SYMPTOMS/MOOD NEUROLOGICAL BOWEL NUTRITION STATUS      Continent Diet(Carb Modified)  AMBULATORY STATUS COMMUNICATION OF NEEDS Skin   Limited Assist Verbally Surgical wounds                       Personal Care Assistance Level of Assistance  Bathing, Feeding, Dressing Bathing Assistance: Limited assistance Feeding assistance: Limited assistance Dressing Assistance: Limited assistance     Functional Limitations Info  Sight, Speech, Hearing Sight Info: Adequate Hearing Info: Adequate Speech Info: Adequate    SPECIAL CARE FACTORS FREQUENCY  PT (By licensed PT), OT (By licensed OT)     PT Frequency: 5 OT Frequency: 5            Contractures Contractures Info: Not present    Additional Factors Info  Code Status, Allergies, Psychotropic Code Status Info: DNR Allergies Info: Codeine, Penicillins, Phenytoin Sodium, Extended Psychotropic Info: escitalopram (LEXAPRO) tablet 10 mg         Current Medications (10/01/2018):  This is the current hospital active medication list Current Facility-Administered Medications  Medication Dose Route Frequency Provider Last Rate Last Dose  . acetaminophen (TYLENOL) tablet 1,000 mg  1,000 mg Oral Q8H Leim Fabry, MD   1,000 mg at 10/01/18 1518  . acetaminophen (TYLENOL) tablet 325-650 mg  325-650 mg Oral Q6H PRN Leim Fabry, MD      . apixaban Arne [place]) tablet 2.5 mg  2.5 mg Oral BID Leim Fabry, MD   2.5 mg at 10/01/18 0933  . bisacodyl (DULCOLAX) suppository 10 mg  10 mg Rectal  Daily PRN Leim Fabry, MD      . diltiazem [facility] CD) 24 hr capsule 240 mg  240 mg Oral Daily Leim Fabry, MD   240 mg at 10/01/18 0933  . diphenhydrAMINE (BENADRYL) 12.5 MG/5ML elixir 12.5-25 mg  12.5-25 mg Oral Q4H PRN Leim Fabry, MD      . docusate sodium (COLACE) capsule 100 mg  100 mg Oral BID Leim Fabry, MD   100 mg at 10/01/18  0933  . donepezil (ARICEPT) tablet 10 mg  10 mg Oral QHS Leim Fabry, MD   10 mg at 09/30/18 2101  . escitalopram (LEXAPRO) tablet 10 mg  10 mg Oral BID Leim Fabry, MD   10 mg at 09/30/18 2222  . feeding supplement (ENSURE ENLIVE) (ENSURE ENLIVE) liquid 237 mL  237 mL Oral BID BM Leim Fabry, MD   237 mL at 10/01/18 1326  . ferrous [id number]-[id number] C-folic acid (TRINSICON / FOLTRIN) capsule 1 capsule  1 capsule Oral BID WC Hessie Knows, MD      . furosemide (LASIX) tablet 20 mg  20 mg Oral BID Leim Fabry, MD   20 mg at 10/01/18 0933  . HYDROmorphone (DILAUDID) injection 0.25-0.5 mg  0.25-0.5 mg Intravenous Q2H PRN Leim Fabry, MD   0.5 mg at 10/01/18 1323  . levothyroxine (SYNTHROID) tablet 75 mcg  75 mcg Oral QAC breakfast Leim Fabry, MD   75 mcg at 10/01/18 0932  . lidocaine (LIDODERM) 5 % 1 patch  1 patch Transdermal Q24H Leim Fabry, MD   1 patch at 09/29/18 2139  . memantine (NAMENDA) tablet 5 mg  5 mg Oral BID Leim Fabry, MD   5 mg at 10/01/18 0933  . methocarbamol (ROBAXIN) tablet 500 mg  500 mg Oral Q6H PRN Leim Fabry, MD   500 mg at 10/01/18 0933   Or  . methocarbamol (ROBAXIN) 500 mg in dextrose 5 % 50 mL IVPB  500 mg Intravenous Q6H PRN Leim Fabry, MD      . metoCLOPramide (REGLAN) tablet 5-10 mg  5-10 mg Oral Q8H PRN Leim Fabry, MD       Or  . metoCLOPramide (REGLAN) injection 5-10 mg  5-10 mg Intravenous Q8H PRN Leim Fabry, MD      . metoprolol succinate (TOPROL-XL) 24 hr tablet 50 mg  50 mg Oral Daily Leim Fabry, MD   50 mg at 10/01/18 0933  . multivitamin with minerals tablet 1 tablet  1 tablet Oral Daily Leim Fabry, MD   1 tablet at 10/01/18 0932  . ondansetron (ZOFRAN) tablet 4 mg  4 mg Oral Q6H PRN Leim Fabry, MD       Or  . ondansetron [facility]) injection 4 mg  4 mg Intravenous Q6H PRN Leim Fabry, MD   4 mg at 10/01/18 1013  . oxyCODONE (Oxy IR/ROXICODONE) immediate release tablet 2.5-5 mg  2.5-5 mg Oral Q3H PRN Leim Fabry, MD   5 mg at  10/01/18 0948  . oxyCODONE (Oxy IR/ROXICODONE) immediate release tablet 5-10 mg  5-10 mg Oral Q4H PRN Leim Fabry, MD   5 mg at 10/01/18 0047  . pantoprazole (PROTONIX) EC tablet 40 mg  40 mg Oral Daily Leim Fabry, MD   40 mg at 10/01/18 0932  . polyethylene glycol (MIRALAX / GLYCOLAX) packet 17 g  17 g Oral Daily PRN Leim Fabry, MD      . potassium chloride SA (K-DUR) CR tablet 10 mEq  10 mEq Oral Daily Leim Fabry, MD   10 mEq at  10/01/18 0932  . ramipril (ALTACE) capsule 5 mg  5 mg Oral Daily Leim Fabry, MD   5 mg at 10/01/18 0934  . senna-docusate (Senokot-S) tablet 1 tablet  1 tablet Oral QHS PRN Leim Fabry, MD      . simvastatin (ZOCOR) tablet 10 mg  10 mg Oral QHS Leim Fabry, MD   10 mg at 09/30/18 2101  . sodium phosphate (FLEET) 7-19 GM/118ML enema 1 enema  1 enema Rectal Once PRN Leim Fabry, MD      . traMADol Veatrice Bourbon) tablet 50 mg  50 mg Oral Q6H PRN Leim Fabry, MD      . vitamin C (ASCORBIC ACID) tablet 1,000 mg  1,000 mg Oral Daily Leim Fabry, MD   1,000 mg at 10/01/18 0932     Discharge Medications: Please see discharge summary for a list of discharge medications.  Relevant Imaging Results:  Relevant Lab Results:   Additional Information SSN: [id number]  Ross Ludwig, LCSW

## 2018-10-01 NOTE — Evaluation (Signed)
Physical Therapy Evaluation Patient Details Name: KRISTOPHER DELK MRN: [id number] DOB: 09-07-1924 Today's Date: 10/01/2018   History of Present Illness  85 year old female with PMHx of dementia, CHF, depression, GERD, HLD, HTN admitted with left intertrochanteric fracture, left wrist fracture, A-fib with RVR, UTI. S/p intertrochanteric IM nail LLE and closed reduction L wrist 6/18.  Clinical Impression  Pt was very limited with what she was able to do with limited attempt at PT exam this date.  Pt with limited verbal interaction but clearly able to indicate pain with L LE exercises (groaning with R LE ex too).  Pt on 3L O2 during session with sats in the mid 80s, VCs for deeper, focused breaths with minimal effect on saturations.  She showed limited effort and clearly was uncomfortable t/o the session.    Follow Up Recommendations SNF;Supervision/Assistance - 24 hour    Equipment Recommendations  None recommended by PT    Recommendations for Other Services       Precautions / Restrictions Precautions Precautions: Fall Required Braces or Orthoses: (cast on L wrist) Restrictions Weight Bearing Restrictions: Yes LUE Weight Bearing: Non weight bearing LLE Weight Bearing: Weight bearing as tolerated      Mobility  Bed Mobility Overal bed mobility: Needs Assistance Bed Mobility: Rolling Rolling: Max assist         General bed mobility comments: deferred mobility as pt was groaning in pain with even the most modest amount of LE movement and a complete inability to engage functionally with any requested tasks.    Transfers                 General transfer comment: Deferred.  Ambulation/Gait                Stairs            Wheelchair Mobility    Modified Rankin (Stroke Patients Only)       Balance Overall balance assessment: (untested, did not get to sitting, likely very limited)                                            Pertinent Vitals/Pain Pain Assessment: 0-10 Faces Pain Scale: Hurts even more Pain Location: Pt in no pain at rest, considerable pain with even the slightest L LE movement Pain Descriptors / Indicators: Grimacing;Guarding Pain Intervention(s): Limited activity within patient's tolerance;Monitored during session;Repositioned    Home Living Family/patient expects to be discharged to:: Private residence Living Arrangements: Alone Available Help at Discharge: Family;Other (Comment)(PACE program 1 hr/d, 4d/wk) Type of Home: Apartment Home Access: Level entry     Home Layout: One level Home Equipment: Grab bars - tub/shower;Grab bars - toilet;Shower seat;Walker - standard      Prior Function Level of Independence: Needs assistance   Gait / Transfers Assistance Needed: Uses walker for household distances  ADL's / Homemaking Assistance Needed: Requires assistance from PACE  Comments: Assist for bathing, dressing, meal prep. Unsafe to do independently.     Hand Dominance   Dominant Hand: Right    Extremity/Trunk Assessment   Upper Extremity Assessment Upper Extremity Assessment: Generalized weakness;Difficult to assess due to impaired cognition LUE Deficits / Details: some AAROM with assist, unable to initiate movement b/l LUE: Unable to fully assess due to immobilization;Unable to fully assess due to pain LUE Coordination: decreased gross motor;decreased fine motor  Lower Extremity Assessment Lower Extremity Assessment: Generalized weakness(unable to even wiggle toes with cuing, PROM t/o (+) pain)       Communication   Communication: No difficulties  Cognition Arousal/Alertness: Lethargic Behavior During Therapy: Restless Overall Cognitive Status: Difficult to assess Area of Impairment: Awareness                 Orientation Level: Person     Following Commands: Follows one step commands inconsistently       General Comments: Pt very limited  verbalization, mostly saying "I can't" and "Don't know"      General Comments General comments (skin integrity, edema, etc.): Pt noted to have SPo2 of 80 while supine in bed on 3L. RN notified and requested OT increase O2 until pt up to 92. OT did as requested and provided pt eduatio non PLB which pt return demonstrated with multimodal cueing. Pt SPO2 increasing to 95 with 5L and prompts for PLB. Pt noted to breath primarily through her mouth when not prompted to PLB. RN notified of pt O2 sats again at end of session.    Exercises General Exercises - Lower Extremity Ankle Circles/Pumps: PROM;10 reps;Both(unable to show any initiation even with tactile cuing) Quad Sets: (attempted to cue with little success bilaterally) Heel Slides: PROM;5 reps;Both(Pt able to at least tolerate on R, very poor tol on L) Hip ABduction/ADduction: PROM;Both;5 reps(pt groaning in pain the entire time with L LE) Other Exercises Other Exercises: Pt educated on PLB as ECS. OT also assisted pt with repositioning in bed as pt was noted to be leaning to her R side.   Assessment/Plan    PT Assessment Patient needs continued PT services  PT Problem List Decreased strength;Decreased range of motion;Decreased activity tolerance;Decreased balance;Decreased mobility;Decreased coordination;Decreased cognition;Decreased knowledge of use of DME;Decreased safety awareness;Pain;Cardiopulmonary status limiting activity       PT Treatment Interventions Gait training;DME instruction;Functional mobility training;Therapeutic activities;Therapeutic exercise;Balance training;Neuromuscular re-education;Cognitive remediation;Patient/family education    PT Goals (Current goals can be found in the Care Plan section)  Acute Rehab PT Goals Patient Stated Goal: no goals stated (apart from wanting to be in heaven) PT Goal Formulation: With patient Time For Goal Achievement: 10/15/18 Potential to Achieve Goals: Poor    Frequency 7X/week    Barriers to discharge        Co-evaluation               AM-PAC PT "6 Clicks" Mobility  Outcome Measure Help needed turning from your back to your side while in a flat bed without using bedrails?: Total Help needed moving from lying on your back to sitting on the side of a flat bed without using bedrails?: Total Help needed moving to and from a bed to a chair (including a wheelchair)?: Total Help needed standing up from a chair using your arms (e.g., wheelchair or bedside chair)?: Total Help needed to walk in hospital room?: Total Help needed climbing 3-5 steps with a railing? : Total 6 Click Score: 6    End of Session Equipment Utilized During Treatment: Gait belt Activity Tolerance: Patient limited by lethargy;Patient limited by pain Patient left: with bed alarm set;with call bell/phone within reach Nurse Communication: Mobility status(O2 in mid 80s t/o session on 2L) PT Visit Diagnosis: Muscle weakness (generalized) (M62.81);Difficulty in walking, not elsewhere classified (R26.2);Pain Pain - Right/Left: Left Pain - part of body: Hip    Time: 1431-1444 PT Time Calculation (min) (ACUTE ONLY): 13 min   Charges:   PT Evaluation $  PT Eval Low Complexity: 1 Low          Kreg Shropshire, DPT 10/01/2018, 4:32 PM

## 2018-10-01 NOTE — Progress Notes (Addendum)
[facility] [place] NAME: Eula Jaster    MR#:  [id number]  DATE OF BIRTH:  09-23-1924  SUBJECTIVE:  CHIEF COMPLAINT:   Chief Complaint  Patient presents with  . Fall  . Hip Pain   Still has some left-sided chest pain with her rib fractures. Complains of nausea today.  REVIEW OF SYSTEMS:    Review of Systems  Constitutional: Positive for malaise/fatigue. Negative for chills and fever.  HENT: Negative for sore throat.   Eyes: Negative for blurred vision, double vision and pain.  Respiratory: Negative for cough, hemoptysis, shortness of breath and wheezing.   Cardiovascular: Positive for chest pain. Negative for palpitations, orthopnea and leg swelling.  Gastrointestinal: Negative for abdominal pain, constipation, diarrhea, heartburn, nausea and vomiting.  Genitourinary: Negative for dysuria and hematuria.  Musculoskeletal: Negative for back pain and joint pain.  Skin: Negative for rash.  Neurological: Negative for sensory change, speech change, focal weakness and headaches.  Endo/Heme/Allergies: Does not bruise/bleed easily.  Psychiatric/Behavioral: Negative for depression. The patient is not nervous/anxious.     DRUG ALLERGIES:   Allergies  Allergen Reactions  . Codeine Other (See Comments)    Reaction: unknown  . Penicillins Other (See Comments)    Unknown reaction and unable to answer follow-up questions  . Phenytoin Sodium Extended Other (See Comments)    Hyperthermia    VITALS:  Blood pressure (!) 113/52, pulse 90, temperature 97.9 F (36.6 C), resp. rate 18, height 5\' 2"  (1.575 m), weight 55 kg, SpO2 92 %.  PHYSICAL EXAMINATION:   Physical Exam  GENERAL:  85 y.o.-year-old patient lying in the bed with no acute distress.  EYES: Pupils equal, round, reactive to light and accommodation. No scleral icterus. Extraocular muscles intact.  HEENT: Head atraumatic, normocephalic. Oropharynx and nasopharynx clear.   NECK:  Supple, no jugular venous distention. No thyroid enlargement, no tenderness.  LUNGS: Normal breath sounds bilaterally, no wheezing, rales, rhonchi. No use of accessory muscles of respiration.  CARDIOVASCULAR: Irregularly irregular and tachycardic ABDOMEN: Soft, nontender, nondistended. Bowel sounds present. No organomegaly or mass.  EXTREMITIES: No cyanosis, clubbing or edema b/l.    NEUROLOGIC: Cranial nerves II through XII are intact. No focal Motor or sensory deficits b/l.   PSYCHIATRIC: The patient is alert and awake SKIN: No obvious rash, lesion, or ulcer.   LABORATORY PANEL:   CBC Recent Labs  Lab 10/01/18 0922  WBC 20.6*  HGB 8.0*  HCT 24.3*  PLT 257   ------------------------------------------------------------------------------------------------------------------ Chemistries  Recent Labs  Lab 09/29/18 1903  10/01/18 0922  NA 142   < > 139  K 3.8   < > 4.0  CL 104   < > 105  CO2 21*   < > 22  GLUCOSE 280*   < > 175*  BUN 30*   < > 58*  CREATININE 1.33*   < > 3.17*  CALCIUM 9.0   < > 8.0*  AST 32  --   --   ALT 17  --   --   ALKPHOS 42  --   --   BILITOT 1.7*  --   --    < > = values in this interval not displayed.   ------------------------------------------------------------------------------------------------------------------  Cardiac Enzymes Recent Labs  Lab 09/29/18 1903  TROPONINI <0.03   ------------------------------------------------------------------------------------------------------------------  RADIOLOGY:  Dg Chest 1 View  Result Date: 09/29/2018 CLINICAL DATA:  Pain status post fall. EXAM: CHEST  1 VIEW COMPARISON:  03/29/2015 FINDINGS: Heart size  is enlarged. There are aortic calcifications. Again noted are findings suspicious for a hiatal hernia. There is no acute osseous abnormality. Surgical clips are noted along the patient's left axilla. No large pleural effusion. No focal infiltrate. There are minimally displaced fractures  involving the 6 through eighth ribs on the left. There is a deep sulcus sign on the left. IMPRESSION: 1. Acute minimally displaced fractures involving the sixth through eighth ribs on the left. There is a deep sulcus on the left concerning for left-sided pneumothorax. Consider upright radiograph for further evaluation of this finding or a follow-up CT. 2. Mild cardiomegaly with aortic calcifications. These results were called by telephone at the time of interpretation on 09/29/2018 at 7:04 pm to [facility] , who verbally acknowledged these results. Electronically Signed   By: Constance Holster M.D.   On: 09/29/2018 19:07   Dg Wrist Complete Left  Result Date: 09/29/2018 CLINICAL DATA:  Pain status post fall EXAM: LEFT WRIST - COMPLETE 3+ VIEW COMPARISON:  None. FINDINGS: There is an acute impacted intra-articular fracture of the distal radius. There is some mild dorsal angulation of the distal fracture fragment. There is an acute minimally displaced fracture of the ulnar styloid process. There are advanced degenerative changes of the first carpometacarpal joint. There is osteopenia. IMPRESSION: 1. Acute impacted intra-articular fracture of the distal radius. 2. Acute minimally displaced fracture of the ulnar styloid process. 3. Osteopenia Electronically Signed   By: Constance Holster M.D.   On: 09/29/2018 19:09   Ct Head Wo Contrast  Result Date: 09/29/2018 CLINICAL DATA:  Status post fall, leg deformity EXAM: CT HEAD WITHOUT CONTRAST CT CERVICAL SPINE WITHOUT CONTRAST TECHNIQUE: Multidetector CT imaging of the head and cervical spine was performed following the standard protocol without intravenous contrast. Multiplanar CT image reconstructions of the cervical spine were also generated. COMPARISON:  None. FINDINGS: CT HEAD FINDINGS Brain: No evidence of acute infarction, hemorrhage, extra-axial collection, ventriculomegaly, or mass effect. Generalized cerebral atrophy. Periventricular white matter  low attenuation likely secondary to microangiopathy. Vascular: Cerebrovascular atherosclerotic calcifications are noted. Skull: Negative for fracture or focal lesion. Prior right frontoparietal craniotomy. Sinuses/Orbits: Visualized portions of the orbits are unremarkable. Visualized portions of the paranasal sinuses and mastoid air cells are unremarkable. Other: None. CT CERVICAL SPINE FINDINGS Alignment: Normal. Skull base and vertebrae: No acute compression fracture. Chronic T1 vertebral body compression fracture. No primary bone lesion or focal pathologic process. Soft tissues and spinal canal: No prevertebral fluid or swelling. No visible canal hematoma. Disc levels: Degenerative disc disease with disc height loss at C3-4, C4-5, C5-6, C6-7. broad-based disc osteophyte complex at C3-4 with bilateral foraminal stenosis and bilateral facet arthropathy. Upper chest: Lung apices are clear. Other: No fluid collection or hematoma. IMPRESSION: 1. No acute intracranial pathology. 2.  No acute osseous injury of the cervical spine. 3. Cervical spine spondylosis as described above. Electronically Signed   By: Kathreen Devoid   On: 09/29/2018 18:39   Ct Chest W Contrast  Result Date: 09/29/2018 CLINICAL DATA:  Trauma. EXAM: CT CHEST WITH CONTRAST TECHNIQUE: Multidetector CT imaging of the chest was performed during intravenous contrast administration. CONTRAST:  31mL OMNIPAQUE IOHEXOL 300 MG/ML  SOLN COMPARISON:  X-ray from same day. FINDINGS: Cardiovascular: The heart is significantly enlarged. There are coronary artery calcifications. There is no large centrally located pulmonary embolus. Detection of smaller segmental and subsegmental pulmonary emboli is severely limited. Aortic calcifications are noted. There is no significant pericardial effusion. Mediastinum/Nodes: No enlarged mediastinal, hilar, or axillary  lymph nodes. Thyroid gland, trachea, and esophagus demonstrate no significant findings. Lungs/Pleura: There is  no pneumothorax. No large pleural effusion. There are scattered areas of atelectasis and scarring bilaterally. Upper Abdomen: The gallbladder is moderately distended. There is slightly greater than expected density near the pancreatic head, which is only partially visualized. There is a large hiatal hernia. The remaining portions of the pancreas are essentially unremarkable. Musculoskeletal: There are displaced and nondisplaced fractures involving the sixth through ninth ribs on the left. There is no pneumothorax. There is sclerosis of the T2 vertebral body. IMPRESSION: 1. Multiple acute displaced and nondisplaced left-sided rib fractures as above with no evidence of a left-sided pneumothorax. 2. Sclerosis of the T2 vertebral body likely represents an age-indeterminate compression fracture. Correlation with point tenderness is recommended. 3. Partially visualized density near the pancreatic head, only partially visualized and suboptimally evaluated on this exam. Correlation with an outpatient contrast enhanced CT is recommended for further evaluation of this finding. 4.  Aortic Atherosclerosis (ICD10-I70.0). 5. Large hiatal hernia with the majority of the stomach located within the thoracic cavity. 6. Cardiomegaly. Electronically Signed   By: Constance Holster M.D.   On: 09/29/2018 20:25   Ct Cervical Spine Wo Contrast  Result Date: 09/29/2018 CLINICAL DATA:  Status post fall, leg deformity EXAM: CT HEAD WITHOUT CONTRAST CT CERVICAL SPINE WITHOUT CONTRAST TECHNIQUE: Multidetector CT imaging of the head and cervical spine was performed following the standard protocol without intravenous contrast. Multiplanar CT image reconstructions of the cervical spine were also generated. COMPARISON:  None. FINDINGS: CT HEAD FINDINGS Brain: No evidence of acute infarction, hemorrhage, extra-axial collection, ventriculomegaly, or mass effect. Generalized cerebral atrophy. Periventricular white matter low attenuation likely  secondary to microangiopathy. Vascular: Cerebrovascular atherosclerotic calcifications are noted. Skull: Negative for fracture or focal lesion. Prior right frontoparietal craniotomy. Sinuses/Orbits: Visualized portions of the orbits are unremarkable. Visualized portions of the paranasal sinuses and mastoid air cells are unremarkable. Other: None. CT CERVICAL SPINE FINDINGS Alignment: Normal. Skull base and vertebrae: No acute compression fracture. Chronic T1 vertebral body compression fracture. No primary bone lesion or focal pathologic process. Soft tissues and spinal canal: No prevertebral fluid or swelling. No visible canal hematoma. Disc levels: Degenerative disc disease with disc height loss at C3-4, C4-5, C5-6, C6-7. broad-based disc osteophyte complex at C3-4 with bilateral foraminal stenosis and bilateral facet arthropathy. Upper chest: Lung apices are clear. Other: No fluid collection or hematoma. IMPRESSION: 1. No acute intracranial pathology. 2.  No acute osseous injury of the cervical spine. 3. Cervical spine spondylosis as described above. Electronically Signed   By: Kathreen Devoid   On: 09/29/2018 18:39   Chest Portable 1 View  Result Date: 09/30/2018 CLINICAL DATA:  Atrial fibrillation EXAM: PORTABLE CHEST 1 VIEW COMPARISON:  09/29/2018 FINDINGS: Cardiomegaly. No confluent airspace opacities, effusions or pneumothorax. Previously seen left side rib fractures are better seen on prior studies. Aortic atherosclerosis. IMPRESSION: Mild cardiomegaly.  No acute cardiopulmonary disease. Electronically Signed   By: Rolm Baptise M.D.   On: 09/30/2018 00:00   Dg Hip Operative Unilat W Or W/o Pelvis Left  Result Date: 09/30/2018 CLINICAL DATA:  Left IM nail EXAM: OPERATIVE LEFT HIP (WITH PELVIS IF PERFORMED) 10 VIEWS TECHNIQUE: Fluoroscopic spot image(s) were submitted for interpretation post-operatively. COMPARISON:  09/29/2018 FINDINGS: Multiple intraoperative spot images demonstrates internal fixation  across the left femoral intertrochanteric fracture. No visible hardware bony complicating feature. IMPRESSION: Internal fixation across the left femoral intertrochanteric fracture. No visible complicating feature. Electronically Signed  By: Rolm Baptise M.D.   On: 09/30/2018 20:08   Dg Hip Unilat W Or Wo Pelvis 2-3 Views Left  Result Date: 09/29/2018 CLINICAL DATA:  Pain status post fall EXAM: DG HIP (WITH OR WITHOUT PELVIS) 2-3V LEFT COMPARISON:  None. FINDINGS: There is an acute comminuted intratrochanteric fracture of the proximal left femur. There is no dislocation. There is diffuse osteopenia. Phleboliths project over the patient's pelvis. There are degenerative changes of the right hip. IMPRESSION: Acute comminuted displaced intratrochanteric fracture of the left femur. Electronically Signed   By: Constance Holster M.D.   On: 09/29/2018 19:08     ASSESSMENT AND PLAN:   *Atrial fibrillation with rapid ventricular rate On metoprolol and Cardizem p.o.  Monitor on telemetry. Appreciate cardiology input.  *  Left intertrochanteric fracture Status post surgery Postop day 1 PT consulted Pain medications as needed Appreciate orthopedic input SNF at discharge  *Acute kidney injury over CKD stage III.  Will start IV fluids.  Foley catheter in place and monitor input and output.  Repeat labs in a.m.  *Chronic leukocytosis is stable  *  Left distal radial fracture - Splint applied in the emergency room  *  Urinary tract infection - Urine culture pending - IV Rocephin  *  Dementia - Aricept and Namenda continued  *Acute blood loss anemia status post surgery.  Monitor.  DVT and PPI prophylaxis  All the records are reviewed and case discussed with Care Management/Social Worker Management plans discussed with the patient, family and they are in agreement.  CODE STATUS: FULL CODE  TOTAL CC TIME TAKING CARE OF THIS PATIENT: 35 minutes.   POSSIBLE D/C IN 2-3 DAYS, DEPENDING ON  CLINICAL CONDITION.  Neita Carp M.D on 10/01/2018 at 12:58 PM  Between 7am to 6pm - Pager - [phone]  After 6pm go to [email or web address] - password [email or web address] [facility]  Office  [phone]  CC: Primary care physician; System, Pcp Not In  Note: This dictation was prepared with Dragon dictation along with smaller phrase technology. Any transcriptional errors that result from this process are unintentional.

## 2018-10-01 NOTE — Progress Notes (Signed)
   Subjective: 1 Day Post-Op Procedure(s) (LRB): INTRAMEDULLARY (IM) NAIL INTERTROCHANTRIC (Left) CLOSED REDUCTION WRIST (Left) Patient reports pain as moderate.   Patient is well, and has had no acute complaints or problems Denies any CP, SOB, ABD pain. We will start physical therapy today.   Objective: Vital signs in last 24 hours: Temp:  [97.1 F (36.2 C)-98.4 F (36.9 C)] 97.9 F (36.6 C) (06/19 0749) Pulse Rate:  [85-119] 90 (06/19 0749) Resp:  [15-20] 18 (06/19 0749) BP: (106-125)/(47-78) 113/52 (06/19 0749) SpO2:  [91 %-100 %] 92 % (06/19 0749)  Intake/Output from previous day: 06/18 0701 - 06/19 0700 In: 658.5 [I.V.:658.5] Out: 200 [Urine:100; Blood:100] Intake/Output this shift: No intake/output data recorded.  Recent Labs    09/29/18 1801 09/30/18 0434 10/01/18 0922  HGB 11.3* 10.2* 8.0*   Recent Labs    09/30/18 0434 10/01/18 0922  WBC 21.0* 20.6*  RBC 3.25* 2.48*  HCT 31.1* 24.3*  PLT 281 257   Recent Labs    09/30/18 0434 10/01/18 0922  NA 144 139  K 4.3 4.0  CL 105 105  CO2 25 22  BUN 37* 58*  CREATININE 1.77* 3.17*  GLUCOSE 179* 175*  CALCIUM 8.8* 8.0*   Recent Labs    09/29/18 1801  INR 1.1    EXAM General - Patient is Alert, Appropriate and Oriented Left lower extremity - Neurovascular intact Sensation intact distally Intact pulses distally Dorsiflexion/Plantar flexion intact No cellulitis present Compartment soft Dressing - dressing C/D/I and no drainage  Left upper extremity -cast intact, minimal ecchymosis along the thumb.  No significant swelling throughout the digits. Motor Function - intact, moving foot and toes well on exam.   Past Medical History:  Diagnosis Date  . CHF (congestive heart failure) ([place])   . Depression   . GERD (gastroesophageal reflux disease)   . Hyperlipidemia   . Hypertension   . Thyroid disease     Assessment/Plan:   1 Day Post-Op Procedure(s) (LRB): INTRAMEDULLARY (IM) NAIL  INTERTROCHANTRIC (Left) CLOSED REDUCTION WRIST (Left) Active Problems:   Intertrochanteric fracture, closed, left, initial encounter ([place])   Fall  Estimated body mass index is 22.18 kg/m as calculated from the following:   Height as of this encounter: 5\' 2"  (1.575 m).   Weight as of this encounter: 55 kg. Advance diet Up with therapy  Needs bowel movement Acute post op blood loss anemia - Hgb 8.0.  Start iron supplementation.  Recheck hgb in the am.  If continued drop in hemoglobin, will transfuse 1 unit packed red blood cells.   DVT Prophylaxis - TED hose and SCDs, Eliquis Weight-Bearing as tolerated to left lower extremity, nonweightbearing left upper extremity  T. Rachelle Hora, PA-C [place] 10/01/2018, 10:19 AM

## 2018-10-01 NOTE — Progress Notes (Signed)
Patient requiring 5 L O2 [place] this afternoon at rest, patient sats 90-93%, shallow and even.   Deneise Lever, NP from [facility] contacted this RN to discuss patient progress today. This RN was informed that patient was DNR/comfort care with [facility], MD [place] notified via text.  Patient son, [name] contacted this afternoon and updated on patient current progress, plan and status by this RN.

## 2018-10-01 NOTE — Care Management Important Message (Signed)
Important Message  Patient Details  Name: Holly Burns MRN: [id number] Date of Birth: 1924-07-23   Medicare Important Message Given:  Yes   Initial Medicare IM given by Patient Access Associate on 09/30/2018 at 1253.  Still valid.  Dannette Barbara 10/01/2018, 9:25 AM

## 2018-10-01 NOTE — TOC Progression Note (Addendum)
Transition of Care [facility]) - Progression Note    Patient Details  Name: Holly Burns MRN: [id number] Date of Birth: 01-Mar-1925  Transition of Care [facility]) CM/SW Contact  Ross Ludwig, [place] Phone Number: 10/01/2018, 3:40 PM  Clinical Narrative:     CSW spoke with [place] nurse from [place] program.  She would like patient to go to [facility], however the son is not in agreement to [facility], he is leaning more towards [facility].  CSW sent updated clinicals and FL2.  CSW has faxed information to [facility] and [facility] to review updated bed offers.  [facility] to notify what patient's son decides.     Barriers to Discharge: Continued Medical Work up  Expected Discharge Plan and Services    Patient plan to go to [facility] contracted SNF.                        Social Determinants of Health (SDOH) Interventions    Readmission Risk Interventions No flowsheet data found.

## 2018-10-01 NOTE — Progress Notes (Signed)
Patient daughter Holly Burns updated by this RN.

## 2018-10-02 LAB — BASIC METABOLIC PANEL
Anion gap: 12 (ref 5–15)
BUN: 78 mg/dL — ABNORMAL HIGH (ref 8–23)
CO2: 23 mmol/L (ref 22–32)
Calcium: 8.2 mg/dL — ABNORMAL LOW (ref 8.9–10.3)
Chloride: 105 mmol/L (ref 98–111)
Creatinine, Ser: 4.05 mg/dL — ABNORMAL HIGH (ref 0.44–1.00)
GFR calc Af Amer: 11 mL/min — ABNORMAL LOW (ref >60)
GFR calc non Af Amer: 9 mL/min — ABNORMAL LOW (ref >60)
Glucose, Bld: 157 mg/dL — ABNORMAL HIGH (ref 70–99)
Potassium: 4.7 mmol/L (ref 3.5–5.1)
Sodium: 140 mmol/L (ref 135–145)

## 2018-10-02 LAB — CBC
HCT: 23.7 % — ABNORMAL LOW (ref 36.0–46.0)
Hemoglobin: 7.6 g/dL — ABNORMAL LOW (ref 12.0–15.0)
MCH: 31.5 pg (ref 26.0–34.0)
MCHC: 32.1 g/dL (ref 30.0–36.0)
MCV: 98.3 fL (ref 80.0–100.0)
Platelets: 307 10*3/uL (ref 150–400)
RBC: 2.41 MIL/uL — ABNORMAL LOW (ref 3.87–5.11)
RDW: 15.6 % — ABNORMAL HIGH (ref 11.5–15.5)
WBC: 23.5 10*3/uL — ABNORMAL HIGH (ref 4.0–10.5)
nRBC: 0.4 % — ABNORMAL HIGH (ref 0.0–0.2)

## 2018-10-02 LAB — PREPARE RBC (CROSSMATCH)

## 2018-10-02 MED ORDER — SODIUM CHLORIDE 0.9% IV SOLUTION
Freq: Once | INTRAVENOUS | Status: AC
  Administered 2018-10-02: 18:00:00 via INTRAVENOUS

## 2018-10-02 MED ORDER — OXYCODONE HCL 5 MG PO TABS: 2.5000 mg | ORAL_TABLET | ORAL | Status: DC | PRN

## 2018-10-02 MED ORDER — OXYCODONE HCL 5 MG PO TABS: 5.0000 mg | ORAL_TABLET | ORAL | 0 refills | Status: AC | PRN

## 2018-10-02 MED ORDER — SODIUM CHLORIDE 0.9 % IV SOLN
INTRAVENOUS | Status: AC
  Administered 2018-10-02: 13:00:00 via INTRAVENOUS

## 2018-10-02 MED ORDER — GUAIFENESIN ER 600 MG PO TB12
600.0000 mg | ORAL_TABLET | Freq: Two times a day (BID) | ORAL | Status: DC
  Administered 2018-10-02 – 2018-10-04 (×4): 600 mg via ORAL
  Filled 2018-10-02 (×4): qty 1

## 2018-10-02 MED ORDER — TRAMADOL HCL 50 MG PO TABS: 50.0000 mg | ORAL_TABLET | Freq: Four times a day (QID) | ORAL | 1 refills | Status: AC | PRN

## 2018-10-02 NOTE — Progress Notes (Addendum)
Physical Therapy Treatment Patient Details Name: Holly Burns MRN: [id number] DOB: October 11, 1924 Today's Date: 10/02/2018    History of Present Illness 85 year old female with PMHx of dementia, CHF, depression, GERD, HLD, HTN admitted with left intertrochanteric fracture, left wrist fracture, A-fib with RVR, UTI. S/p intertrochanteric IM nail LLE and closed reduction L wrist 6/18.    PT Comments    Pt in bed.  8 lpm at rest with sats 97-98%.  Participated in exercises as described below. Tolerated PROM well with little pain today and no change in O2 sats.  To edge of bed with max a x 1.  She was able to sit for 5 minutes with mod a x 1 before fatigue with stable O2 sat readings.  Returned to supine with max a x 1 and positioned for comfort.  RN notified of O2 sat readings and good tolerance of activity.  OOB mobility deferred this session due to level of assist.   Follow Up Recommendations  SNF;Supervision/Assistance - 24 hour     Equipment Recommendations  None recommended by PT    Recommendations for Other Services       Precautions / Restrictions Precautions Precautions: Fall Restrictions Weight Bearing Restrictions: Yes LUE Weight Bearing: Non weight bearing LLE Weight Bearing: Weight bearing as tolerated    Mobility  Bed Mobility Overal bed mobility: Needs Assistance Bed Mobility: Supine to Sit Rolling: Max assist   Supine to sit: Max assist        Transfers                 General transfer comment: Deferred.  Ambulation/Gait                 Stairs             Wheelchair Mobility    Modified Rankin (Stroke Patients Only)       Balance Overall balance assessment: Needs assistance Sitting-balance support: Feet supported Sitting balance-Leahy Scale: Poor Sitting balance - Comments: requires assist to prevent fall backwards                                    Cognition Arousal/Alertness: Awake/alert Behavior  During Therapy: WFL for tasks assessed/performed Overall Cognitive Status: Difficult to assess                                        Exercises Other Exercises Other Exercises: supine ROM LLE ank;e pumps, heel slides, ab/add and slr x 10    General Comments        Pertinent Vitals/Pain Pain Assessment: Faces Faces Pain Scale: Hurts a little bit Pain Location: generally comfortable today with mobility Pain Descriptors / Indicators: Grimacing Pain Intervention(s): Limited activity within patient's tolerance;Monitored during session    Home Living                      Prior Function            PT Goals (current goals can now be found in the care plan section) Progress towards PT goals: Progressing toward goals    Frequency    7X/week      PT Plan Current plan remains appropriate    Co-evaluation              AM-PAC PT "6  Clicks" Mobility   Outcome Measure  Help needed turning from your back to your side while in a flat bed without using bedrails?: Total Help needed moving from lying on your back to sitting on the side of a flat bed without using bedrails?: Total Help needed moving to and from a bed to a chair (including a wheelchair)?: Total Help needed standing up from a chair using your arms (e.g., wheelchair or bedside chair)?: Total Help needed to walk in hospital room?: Total Help needed climbing 3-5 steps with a railing? : Total 6 Click Score: 6    End of Session Equipment Utilized During Treatment: Gait belt Activity Tolerance: Patient tolerated treatment well;Patient limited by fatigue Patient left: with bed alarm set;with call bell/phone within reach;in bed Nurse Communication: Other (comment) Pain - Right/Left: Left Pain - part of body: Hip     Time: 6160-7371 PT Time Calculation (min) (ACUTE ONLY): 16 min  Charges:  $Therapeutic Exercise: 8-22 mins                     Chesley Noon, PTA 10/02/18, 1:13 PM

## 2018-10-02 NOTE — Progress Notes (Signed)
   Subjective: 2 Days Post-Op Procedure(s) (LRB): INTRAMEDULLARY (IM) NAIL INTERTROCHANTRIC (Left) CLOSED REDUCTION WRIST (Left) Patient reports pain as moderate.   Patient is well, and has had no acute complaints or problems Denies any CP, SOB, ABD pain. We will continue physical therapy today.   Objective: Vital signs in last 24 hours: Temp:  [98.3 F (36.8 C)-99.3 F (37.4 C)] 99.3 F (37.4 C) (06/20 0450) Pulse Rate:  [52-86] 72 (06/20 0450) Resp:  [16-18] 17 (06/20 0450) BP: (103-126)/(46-55) 110/46 (06/20 0450) SpO2:  [90 %-93 %] 93 % (06/20 0646) Weight:  [38.1 kg] 38.1 kg (06/20 0450)  Intake/Output from previous day: 06/19 0701 - 06/20 0700 In: 100 [IV Piggyback:100] Out: 350 [Urine:350] Intake/Output this shift: No intake/output data recorded.  Recent Labs    09/29/18 1801 09/30/18 0434 10/01/18 0922 10/02/18 0620  HGB 11.3* 10.2* 8.0* 7.6*   Recent Labs    10/01/18 0922 10/02/18 0620  WBC 20.6* 23.5*  RBC 2.48* 2.41*  HCT 24.3* 23.7*  PLT 257 307   Recent Labs    10/01/18 0922 10/02/18 0620  NA 139 140  K 4.0 4.7  CL 105 105  CO2 22 23  BUN 58* 78*  CREATININE 3.17* 4.05*  GLUCOSE 175* 157*  CALCIUM 8.0* 8.2*   Recent Labs    09/29/18 1801  INR 1.1    EXAM General - Patient is Alert, Appropriate and Oriented Left lower extremity - Neurovascular intact Sensation intact distally Intact pulses distally Dorsiflexion/Plantar flexion intact No cellulitis present Compartment soft Dressing - dressing C/D/I and no drainage  Left upper extremity -cast intact, minimal ecchymosis along the thumb.  No significant swelling throughout the digits. Motor Function - intact, moving foot and toes well on exam.   Past Medical History:  Diagnosis Date  . CHF (congestive heart failure) ([place])   . Depression   . GERD (gastroesophageal reflux disease)   . Hyperlipidemia   . Hypertension   . Thyroid disease     Assessment/Plan:   2 Days Post-Op  Procedure(s) (LRB): INTRAMEDULLARY (IM) NAIL INTERTROCHANTRIC (Left) CLOSED REDUCTION WRIST (Left) Active Problems:   Intertrochanteric fracture, closed, left, initial encounter ([place])   Fall  Estimated body mass index is 15.36 kg/m as calculated from the following:   Height as of this encounter: 5\' 2"  (1.575 m).   Weight as of this encounter: 38.1 kg. Advance diet Up with therapy  Needs bowel movement Acute post op blood loss anemia - Hgb 7.6.  The patient will receive 1 unit of transfused blood.  Start iron supplementation.  Recheck hgb in the am.     DVT Prophylaxis - TED hose and SCDs, Eliquis Weight-Bearing as tolerated to left lower extremity, nonweightbearing left upper extremity  Reche Dixon, PA-C [place] 10/02/2018, 8:02 AM

## 2018-10-02 NOTE — Consult Note (Signed)
[facility]  CONSULT NOTE    Date: 10/02/2018                  Patient Name:  Holly Burns  MRN: [id number]  DOB: Aug 05, 1924  Age / Sex: 85 y.o., female         PCP: System, Pcp Not In                 Service Requesting Consult: Dr. Fritzi Mandes                 Reason for Consult: Acute renal failure            History of Present Illness: Holly Burns has increasing creatinine after CT with contrast of chest on 6/17. Patient was admitted on 09/29/2018 for fall where patient was found to have fractured left ribs, fractured left hip and distal radial fracture. Patient underwent hip surgery on 6/18 by Dr. Rudene Christians. Nephrology consulted for acute renal failure. Patient is very sleepy today and unable to give much history.   Medications: Outpatient medications: Medications Prior to Admission  Medication Sig Dispense Refill Last Dose  . acetaminophen (TYLENOL) 500 MG tablet Take 500-1,000 mg by mouth every 6 (six) hours as needed.    prn at prn  . diltiazem (CARDIZEM CD) 240 MG 24 hr capsule Take 1 capsule (240 mg total) by mouth daily as needed. 1 capsule 1 09/28/2018 at Unknown time  . donepezil (ARICEPT) 10 MG tablet Take 10 mg by mouth at bedtime.   09/28/2018 at Unknown time  . ELIQUIS 2.5 MG TABS tablet TAKE (1) TABLET TWICE A DAY. 60 tablet 5 09/28/2018 at Unknown time  . escitalopram (LEXAPRO) 10 MG tablet Take 10 mg by mouth 2 (two) times a day.    09/28/2018 at Unknown time  . furosemide (LASIX) 20 MG tablet Take 1 tablet (20 mg total) by mouth 2 (two) times daily. 180 tablet 3 09/28/2018 at Unknown time  . levothyroxine (SYNTHROID) 75 MCG tablet Take 1 tablet (75 mcg total) by mouth daily before breakfast. 90 tablet 3 09/28/2018 at Unknown time  . LORazepam (ATIVAN) 1 MG tablet Take 1 tablet (1 mg total) by mouth every 4 (four) hours as needed for anxiety. (Patient taking differently: Take 0.5-1 mg by mouth every 4 (four) hours as needed for anxiety. )  180 tablet 1 prn at prn  . memantine (NAMENDA) 10 MG tablet Take 10 mg by mouth 2 (two) times daily.    09/28/2018 at Unknown time  . metoprolol succinate (TOPROL-XL) 25 MG 24 hr tablet Take 1 tablet (25 mg total) by mouth daily. 90 tablet 3 09/28/2018 at Unknown time  . omeprazole (PRILOSEC) 20 MG capsule Take 20 mg by mouth daily.    09/28/2018 at Unknown time  . OXYGEN 2 L.   unknown at unknown  . potassium chloride (MICRO-K) 10 MEQ CR capsule Take 1 capsule (10 mEq total) by mouth daily. 90 capsule 3 09/28/2018 at Unknown time  . ramipril (ALTACE) 5 MG capsule Take 1 capsule (5 mg total) by mouth daily. 60 capsule 1 09/28/2018 at Unknown time  . simvastatin (ZOCOR) 20 MG tablet Take 1 tablet (20 mg total) by mouth at bedtime. 90 tablet 3 09/28/2018 at Unknown time  . vitamin C (ASCORBIC ACID) 500 MG tablet Take 1,000 mg by mouth daily.    09/28/2018 at Unknown time    Current medications: Current Facility-Administered Medications  Medication Dose Route Frequency  Provider Last Rate Last Dose  . 0.9 %  sodium chloride infusion (Manually program via Guardrails IV Fluids)   Intravenous Once Reche Dixon, PA-C      . 0.9 %  sodium chloride infusion   Intravenous Continuous Fritzi Mandes, MD 75 mL/hr at 10/02/18 1303    . acetaminophen (TYLENOL) tablet 325-650 mg  325-650 mg Oral Q6H PRN Leim Fabry, MD      . apixaban Arne Cleveland) tablet 2.5 mg  2.5 mg Oral BID Leim Fabry, MD   2.5 mg at 10/02/18 1306  . bisacodyl (DULCOLAX) suppository 10 mg  10 mg Rectal Daily PRN Leim Fabry, MD      . diltiazem (CARDIZEM CD) 24 hr capsule 240 mg  240 mg Oral Daily Leim Fabry, MD   240 mg at 10/02/18 1314  . diphenhydrAMINE (BENADRYL) 12.5 MG/5ML elixir 12.5-25 mg  12.5-25 mg Oral Q4H PRN Leim Fabry, MD      . docusate sodium (COLACE) capsule 100 mg  100 mg Oral BID Leim Fabry, MD   100 mg at 10/02/18 1315  . donepezil (ARICEPT) tablet 10 mg  10 mg Oral QHS Leim Fabry, MD   10 mg at 10/01/18 2148  .  escitalopram (LEXAPRO) tablet 10 mg  10 mg Oral BID Leim Fabry, MD   10 mg at 10/02/18 1319  . feeding supplement (ENSURE ENLIVE) (ENSURE ENLIVE) liquid 237 mL  237 mL Oral BID BM Leim Fabry, MD   237 mL at 10/02/18 1344  . ferrous [id number] C-folic acid (TRINSICON / FOLTRIN) capsule 1 capsule  1 capsule Oral BID WC Hessie Knows, MD   1 capsule at 10/02/18 1319  . levothyroxine (SYNTHROID) tablet 75 mcg  75 mcg Oral QAC breakfast Leim Fabry, MD   75 mcg at 10/01/18 0932  . lidocaine (LIDODERM) 5 % 1 patch  1 patch Transdermal Q24H Leim Fabry, MD   1 patch at 10/01/18 2150  . memantine (NAMENDA) tablet 5 mg  5 mg Oral BID Leim Fabry, MD   5 mg at 10/02/18 1318  . methocarbamol (ROBAXIN) tablet 500 mg  500 mg Oral Q6H PRN Leim Fabry, MD   500 mg at 10/01/18 0933   Or  . methocarbamol (ROBAXIN) 500 mg in dextrose 5 % 50 mL IVPB  500 mg Intravenous Q6H PRN Leim Fabry, MD      . metoCLOPramide (REGLAN) tablet 5-10 mg  5-10 mg Oral Q8H PRN Leim Fabry, MD       Or  . metoCLOPramide (REGLAN) injection 5-10 mg  5-10 mg Intravenous Q8H PRN Leim Fabry, MD      . metoprolol succinate (TOPROL-XL) 24 hr tablet 50 mg  50 mg Oral Daily Leim Fabry, MD   50 mg at 10/02/18 1317  . multivitamin with minerals tablet 1 tablet  1 tablet Oral Daily Leim Fabry, MD   1 tablet at 10/02/18 1316  . ondansetron (ZOFRAN) tablet 4 mg  4 mg Oral Q6H PRN Leim Fabry, MD       Or  . ondansetron [facility]) injection 4 mg  4 mg Intravenous Q6H PRN Leim Fabry, MD   4 mg at 10/01/18 1013  . pantoprazole (PROTONIX) EC tablet 40 mg  40 mg Oral Daily Leim Fabry, MD   40 mg at 10/02/18 1316  . polyethylene glycol (MIRALAX / GLYCOLAX) packet 17 g  17 g Oral Daily PRN Leim Fabry, MD      . senna-docusate (Senokot-S) tablet 1 tablet  1 tablet Oral QHS PRN  Leim Fabry, MD      . simvastatin [facility]) tablet 10 mg  10 mg Oral QHS Leim Fabry, MD   10 mg at 10/01/18 2219  . sodium phosphate (FLEET) 7-19  GM/118ML enema 1 enema  1 enema Rectal Once PRN Leim Fabry, MD      . traMADol Veatrice Bourbon) tablet 50 mg  50 mg Oral Q6H PRN Fritzi Mandes, MD      . vitamin C (ASCORBIC ACID) tablet 1,000 mg  1,000 mg Oral Daily Leim Fabry, MD   1,000 mg at 10/02/18 1317      Allergies: Allergies  Allergen Reactions  . Codeine Other (See Comments)    Reaction: unknown  . Penicillins Other (See Comments)    Unknown reaction and unable to answer follow-up questions  . Phenytoin Sodium Extended Other (See Comments)    Hyperthermia      Past Medical History: Past Medical History:  Diagnosis Date  . CHF (congestive heart failure) ([place])   . Depression   . GERD (gastroesophageal reflux disease)   . Hyperlipidemia   . Hypertension   . Thyroid disease      Past Surgical History: Past Surgical History:  Procedure Laterality Date  . ABDOMINAL HYSTERECTOMY  1980  . ANGIOPLASTY     due to MI LV ef-40%  . cataract surgery Left   . CLOSED REDUCTION WRIST FRACTURE Left 09/30/2018   Procedure: CLOSED REDUCTION WRIST;  Surgeon: Hessie Knows, MD;  Location: [facility] ORS;  Service: Orthopedics;  Laterality: Left;  . CRANIOTOMY    . INTRAMEDULLARY (IM) NAIL INTERTROCHANTERIC Left 09/30/2018   Procedure: INTRAMEDULLARY (IM) NAIL INTERTROCHANTRIC;  Surgeon: Hessie Knows, MD;  Location: [facility] ORS;  Service: Orthopedics;  Laterality: Left;  Marland Kitchen MASTECTOMY Left 02/26/1995   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, no chemo or radiation      Family History: Family History  Problem Relation Age of Onset  . Hypertension Mother   . Cerebrovascular Accident Mother   . Heart disease Father      Social History: Social History   Socioeconomic History  . Marital status: Widowed    Spouse name: Not on file  . Number of children: 2  . Years of education: H/S  . Highest education level: Not on file  Occupational History  . Occupation: Retired  Scientific laboratory technician  . Financial resource strain: Not on file  . Food  insecurity    Worry: Not on file    Inability: Not on file  . Transportation needs    Medical: Not on file    Non-medical: Not on file  Tobacco Use  . Smoking status: Never Smoker  . Smokeless tobacco: Never Used  Substance and Sexual Activity  . Alcohol use: No  . Drug use: No  . Sexual activity: Not on file  Lifestyle  . Physical activity    Days per week: Not on file    Minutes per session: Not on file  . Stress: Not on file  Relationships  . Social Herbalist on phone: Not on file    Gets together: Not on file    Attends religious service: Not on file    Active member of club or organization: Not on file    Attends meetings of clubs or organizations: Not on file    Relationship status: Not on file  . Intimate partner violence    Fear of current or ex partner: Not on file    Emotionally abused: Not  on file    Physically abused: Not on file    Forced sexual activity: Not on file  Other Topics Concern  . Not on file  Social History Narrative  . Not on file     Review of Systems: Review of Systems  Unable to perform ROS: Mental status change    Vital Signs: Blood pressure (!) 114/40, pulse 73, temperature 98 F (36.7 C), resp. rate 20, height 5\' 2"  (1.575 m), weight 38.1 kg, SpO2 98 %.  Weight trends: Filed Weights   09/29/18 1748 10/02/18 0450  Weight: 55 kg 38.1 kg    Physical Exam: General: NAD, laying in bed  Head: Normocephalic, atraumatic. Dry oral mucosal membranes  Eyes: Anicteric, PERRL  Neck: Supple, trachea midline  Lungs:  Clear to auscultation  Heart: irregular  Abdomen:  Soft, nontender,   Extremities: trace peripheral edema. Left arm in cast  Neurologic: Nonfocal, moving all four extremities  Skin: No lesions         Lab results: Basic Metabolic Panel: Recent Labs  Lab 09/30/18 0434 10/01/18 0922 10/02/18 0620  NA 144 139 140  K 4.3 4.0 4.7  CL 105 105 105  CO2 25 22 23   GLUCOSE 179* 175* 157*  BUN 37* 58* 78*   CREATININE 1.77* 3.17* 4.05*  CALCIUM 8.8* 8.0* 8.2*    Liver Function Tests: Recent Labs  Lab 09/29/18 1903  AST 32  ALT 17  ALKPHOS 42  BILITOT 1.7*  PROT 6.8  ALBUMIN 4.2   No results for input(s): LIPASE, AMYLASE in the last 168 hours. No results for input(s): AMMONIA in the last 168 hours.  CBC: Recent Labs  Lab 09/29/18 1801 09/30/18 0434 10/01/18 0922 10/02/18 0620  WBC 21.1* 21.0* 20.6* 23.5*  NEUTROABS 18.9*  --   --   --   HGB 11.3* 10.2* 8.0* 7.6*  HCT 34.6* 31.1* 24.3* 23.7*  MCV 95.8 95.7 98.0 98.3  PLT 296 281 257 307    Cardiac Enzymes: Recent Labs  Lab 09/29/18 1903  TROPONINI <0.03    BNP: Invalid input(s): POCBNP  CBG: No results for input(s): GLUCAP in the last 168 hours.  Microbiology: Results for orders placed or performed during the hospital encounter of 09/29/18  SARS Coronavirus 2 (CEPHEID - Performed in [place] hospital lab), Hosp Order     Status: None   Collection Time: 09/29/18  7:04 PM   Specimen: Nasopharyngeal Swab  Result Value Ref Range Status   SARS Coronavirus 2 NEGATIVE NEGATIVE Final    Comment: (NOTE) If result is NEGATIVE SARS-CoV-2 target nucleic acids are NOT DETECTED. The SARS-CoV-2 RNA is generally detectable in upper and lower  respiratory specimens during the acute phase of infection. The lowest  concentration of SARS-CoV-2 viral copies this assay can detect is 250  copies / mL. A negative result does not preclude SARS-CoV-2 infection  and should not be used as the sole basis for treatment or other  patient management decisions.  A negative result may occur with  improper specimen collection / handling, submission of specimen other  than nasopharyngeal swab, presence of viral mutation(s) within the  areas targeted by this assay, and inadequate number of viral copies  (<250 copies / mL). A negative result must be combined with clinical  observations, patient history, and epidemiological  information. If result is POSITIVE SARS-CoV-2 target nucleic acids are DETECTED. The SARS-CoV-2 RNA is generally detectable in upper and lower  respiratory specimens dur ing the acute phase of infection.  Positive  results are indicative of active infection with SARS-CoV-2.  Clinical  correlation with patient history and other diagnostic information is  necessary to determine patient infection status.  Positive results do  not rule out bacterial infection or co-infection with other viruses. If result is PRESUMPTIVE POSTIVE SARS-CoV-2 nucleic acids MAY BE PRESENT.   A presumptive positive result was obtained on the submitted specimen  and confirmed on repeat testing.  While 2019 novel coronavirus  (SARS-CoV-2) nucleic acids may be present in the submitted sample  additional confirmatory testing may be necessary for epidemiological  and / or clinical management purposes  to differentiate between  SARS-CoV-2 and other Sarbecovirus currently known to infect humans.  If clinically indicated additional testing with an alternate test  methodology [id number]) is advised. The SARS-CoV-2 RNA is generally  detectable in upper and lower respiratory sp ecimens during the acute  phase of infection. The expected result is Negative. Fact Sheet for Patients:  StrictlyIdeas.no Fact Sheet for Healthcare Providers: BankingDealers.co.za This test is not yet approved or cleared by the [place] FDA and has been authorized for detection and/or diagnosis of SARS-CoV-2 by FDA under an Emergency Use Authorization (EUA).  This EUA will remain in effect (meaning this test can be used) for the duration of the COVID-19 declaration under Section 564(b)(1) of the Act, 21 U.S.C. section 360bbb-3(b)(1), unless the authorization is terminated or revoked sooner. Performed at [facility], [address], [place], [place] [address]   Surgical pcr screen      Status: None   Collection Time: 09/30/18  1:14 AM   Specimen: Nasal Mucosa; Nasal Swab  Result Value Ref Range Status   MRSA, PCR NEGATIVE NEGATIVE Final   Staphylococcus aureus NEGATIVE NEGATIVE Final    Comment: (NOTE) The Xpert SA Assay (FDA approved for NASAL specimens in patients 37 years of age and older), is one component of a comprehensive surveillance program. It is not intended to diagnose infection nor to guide or monitor treatment. Performed at [facility], [address] [place], [place] [address]     Coagulation Studies: Recent Labs    09/29/18 1801  LABPROT 14.5  INR 1.1    Urinalysis: Recent Labs    09/29/18 1801  COLORURINE YELLOW*  LABSPEC 1.018  PHURINE 5.0  GLUCOSEU NEGATIVE  HGBUR NEGATIVE  BILIRUBINUR NEGATIVE  KETONESUR 20*  PROTEINUR NEGATIVE  NITRITE NEGATIVE  LEUKOCYTESUR TRACE*      Imaging: Dg Hip Operative Unilat W Or W/o Pelvis Left  Result Date: 09/30/2018 CLINICAL DATA:  Left IM nail EXAM: OPERATIVE LEFT HIP (WITH PELVIS IF PERFORMED) 10 VIEWS TECHNIQUE: Fluoroscopic spot image(s) were submitted for interpretation post-operatively. COMPARISON:  09/29/2018 FINDINGS: Multiple intraoperative spot images demonstrates internal fixation across the left femoral intertrochanteric fracture. No visible hardware bony complicating feature. IMPRESSION: Internal fixation across the left femoral intertrochanteric fracture. No visible complicating feature. Electronically Signed   By: Rolm Baptise M.D.   On: 09/30/2018 20:08      Assessment & Plan: Holly Burns is a 85 y.o. white female with hypertension, hyperlipidemia, congestive heart failure, depression, , who was admitted to [facility] on 09/29/2018 for fall with rib fractures, left hip fracture and closed left wrist fracture. Status post hip surgery on 6/18 by Dr. Rudene Christians.  1. Acute renal failure on chronic kidney disease stage III: Baseline creatinine of 1.4, GFR of 33 on  03/17/2018.  Bland urine.  Chronic Kidney Disease is secondary to hypertension Acute renal failure secondary  to IV contrast nephropathy. CTA of chest on 6/17.  - Currently on IV fluids. Will continue for one more day.  - Holding home regimen of ramipril and furosemide.   2. Hypertension: on diltazem and metoprolol  3. Urinary tract infection: completed course of ceftriaxone.      LOS: 3 Winfred Iiams 6/20/20203:06 PM

## 2018-10-02 NOTE — Discharge Instructions (Signed)
INSTRUCTIONS AFTER Surgery  o Remove items at home which could result in a fall. This includes throw rugs or furniture in walking pathways o ICE to the affected joint every three hours while awake for 30 minutes at a time, for at least the first 3-5 days, and then as needed for pain and swelling.  Continue to use ice for pain and swelling. You may notice swelling that will progress down to the foot and ankle.  This is normal after surgery.  Elevate your leg when you are not up walking on it.   o Continue to use the breathing machine you got in the hospital (incentive spirometer) which will help keep your temperature down.  It is common for your temperature to cycle up and down following surgery, especially at night when you are not up moving around and exerting yourself.  The breathing machine keeps your lungs expanded and your temperature down.   DIET:  As you were doing prior to hospitalization, we recommend a well-balanced diet.  DRESSING / WOUND CARE / SHOWERING  Dressing changes needed.  No showering.  Bandages are waterproof.  Cast needs to remain dry.  ACTIVITY  o Increase activity slowly as tolerated, but follow the weight bearing instructions below.   o No driving for 6 weeks or until further direction given by your physician.  You cannot drive while taking narcotics.  o No lifting or carrying greater than 10 lbs. until further directed by your surgeon. o Avoid periods of inactivity such as sitting longer than an hour when not asleep. This helps prevent blood clots.  o You may return to work once you are authorized by your doctor.     WEIGHT BEARING  Weightbearing as tolerated on the left lower extremity. Nonweightbearing on the left upper extremity.   EXERCISES Gait training and ambulation.  Forearm walker for left wrist fracture  CONSTIPATION  Constipation is defined medically as fewer than three stools per week and severe constipation as less than one stool per week.   Even if you have a regular bowel pattern at home, your normal regimen is likely to be disrupted due to multiple reasons following surgery.  Combination of anesthesia, postoperative narcotics, change in appetite and fluid intake all can affect your bowels.   YOU MUST use at least one of the following options; they are listed in order of increasing strength to get the job done.  They are all available over the counter, and you may need to use some, POSSIBLY even all of these options:    Drink plenty of fluids (prune juice may be helpful) and high fiber foods Colace 100 mg by mouth twice a day  Senokot for constipation as directed and as needed Dulcolax (bisacodyl), take with full glass of water  Miralax (polyethylene glycol) once or twice a day as needed.  If you have tried all these things and are unable to have a bowel movement in the first 3-4 days after surgery call either your surgeon or your primary doctor.    If you experience loose stools or diarrhea, hold the medications until you stool forms back up.  If your symptoms do not get better within 1 week or if they get worse, check with your doctor.  If you experience "the worst abdominal pain ever" or develop nausea or vomiting, please contact the office immediately for further recommendations for treatment.   ITCHING:  If you experience itching with your medications, try taking only a single pain pill, or  even half a pain pill at a time.  You can also use Benadryl over the counter for itching or also to help with sleep.   TED HOSE STOCKINGS:  Use stockings on both legs until for at least 2 weeks or as directed by physician office. They may be removed at night for sleeping.  MEDICATIONS:  See your medication summary on the After Visit Summary that nursing will review with you.  You may have some home medications which will be placed on hold until you complete the course of blood thinner medication.  It is important for you to complete the  blood thinner medication as prescribed.  PRECAUTIONS:  If you experience chest pain or shortness of breath - call 911 immediately for transfer to the hospital emergency department.   If you develop a fever greater that 101 F, purulent drainage from wound, increased redness or drainage from wound, foul odor from the wound/dressing, or calf pain - CONTACT YOUR SURGEON.                                                   FOLLOW-UP APPOINTMENTS:  If you do not already have a post-op appointment, please call the office for an appointment to be seen by your surgeon.  Guidelines for how soon to be seen are listed in your After Visit Summary, but are typically between 1-4 weeks after surgery.  OTHER INSTRUCTIONS:     MAKE SURE YOU:   Understand these instructions.   Get help right away if you are not doing well or get worse.    Thank you for letting us be a part of your medical care team.  It is a privilege we respect greatly.  We hope these instructions will help you stay on track for a fast and full recovery!

## 2018-10-02 NOTE — Progress Notes (Signed)
Patient ID: Holly Burns, female   DOB: 1925-03-24, 85 y.o.   MRN: [id number]  Left message for patient's son [name]

## 2018-10-02 NOTE — Progress Notes (Signed)
Late Entry: talked to patient's son [name] and updated him about the plan of care for this patient. Also asked him for telephone consent for patient's blood transfusion. Dialled patient's room phone and let them talked to each other. Son is asking if he can visit, I told him we need to talk to our director first if this is ok, we will update him once they allow him to visit patient. RN will continue to monitor,

## 2018-10-02 NOTE — Progress Notes (Signed)
Holly Burns at Holly Burns NAME: Holly Burns    MR#:  [id number]  DATE OF BIRTH:  [birth year]  SUBJECTIVE:   Still has some left-sided chest pain with her rib fractures. No new issues per RN. More sleepy  REVIEW OF SYSTEMS:    Review of Systems  Constitutional: Positive for malaise/fatigue. Negative for chills and fever.  HENT: Negative for sore throat.   Eyes: Negative for blurred vision, double vision and pain.  Respiratory: Negative for cough, hemoptysis, shortness of breath and wheezing.   Cardiovascular: Positive for chest pain. Negative for palpitations, orthopnea and leg swelling.  Gastrointestinal: Negative for abdominal pain, constipation, diarrhea, heartburn, nausea and vomiting.  Genitourinary: Negative for dysuria and hematuria.  Musculoskeletal: Negative for back pain and joint pain.  Skin: Negative for rash.  Neurological: Negative for sensory change, speech change, focal weakness and headaches.  Endo/Heme/Allergies: Does not bruise/bleed easily.  Psychiatric/Behavioral: Negative for depression. The patient is not nervous/anxious.     DRUG ALLERGIES:   Allergies  Allergen Reactions  . Codeine Other (See Comments)    Reaction: unknown  . Penicillins Other (See Comments)    Unknown reaction and unable to answer follow-up questions  . Phenytoin Sodium Extended Other (See Comments)    Hyperthermia    VITALS:  Blood pressure (!) 114/40, pulse 73, temperature 98 F (36.7 C), resp. rate 20, height 5\' 2"  (1.575 m), weight 38.1 kg, SpO2 98 %.  PHYSICAL EXAMINATION:   Physical Exam  GENERAL:  85 y.o.-year-old patient lying in the bed with no acute distress. Holly Burns, weak EYES: Pupils equal, round, reactive to light and accommodation. No scleral icterus. Extraocular muscles intact.  HEENT: Head atraumatic, normocephalic. Oropharynx and nasopharynx clear.dry mucosa NECK:  Supple, no jugular venous distention. No thyroid  enlargement, no tenderness.  LUNGS: Normal breath sounds bilaterally, no wheezing, rales, rhonchi. No use of accessory muscles of respiration.  CARDIOVASCULAR: Irregularly irregular and tachycardic ABDOMEN: Soft, nontender, nondistended. Bowel sounds present. No organomegaly or mass.  EXTREMITIES: No cyanosis, clubbing or edema b/l.    NEUROLOGIC: unable to assess in detail. Grossly nonfocal PSYCHIATRIC: patient is sleepy SKIN: No obvious rash, lesion, or ulcer.   LABORATORY PANEL:   CBC Recent Labs  Lab 10/02/18 0620  WBC 23.5*  HGB 7.6*  HCT 23.7*  PLT 307   ------------------------------------------------------------------------------------------------------------------ Chemistries  Recent Labs  Lab 09/29/18 1903  10/02/18 0620  NA 142   < > 140  K 3.8   < > 4.7  CL 104   < > 105  CO2 21*   < > 23  GLUCOSE 280*   < > 157*  BUN 30*   < > 78*  CREATININE 1.33*   < > 4.05*  CALCIUM 9.0   < > 8.2*  AST 32  --   --   ALT 17  --   --   ALKPHOS 42  --   --   BILITOT 1.7*  --   --    < > = values in this interval not displayed.   ------------------------------------------------------------------------------------------------------------------  Cardiac Enzymes Recent Labs  Lab 09/29/18 1903  TROPONINI <0.03   ------------------------------------------------------------------------------------------------------------------  RADIOLOGY:  Dg Hip Operative Unilat W Or W/o Pelvis Left  Result Date: 09/30/2018 CLINICAL DATA:  Left IM nail EXAM: OPERATIVE LEFT HIP (WITH PELVIS IF PERFORMED) 10 VIEWS TECHNIQUE: Fluoroscopic spot image(s) were submitted for interpretation post-operatively. COMPARISON:  09/29/2018 FINDINGS: Multiple intraoperative spot images demonstrates internal fixation across the left  femoral intertrochanteric fracture. No visible hardware bony complicating feature. IMPRESSION: Internal fixation across the left femoral intertrochanteric fracture. No visible  complicating feature. Electronically Signed   By: Rolm Baptise M.D.   On: 09/30/2018 20:08     ASSESSMENT AND PLAN:   *Atrial fibrillation with rapid ventricular rate On metoprolol and Cardizem p.o.  Monitor on telemetry. Appreciate cardiology input. -HR in the 70's  *  Left intertrochanteric fracture Status post surgery Postop day 2 PT consulted--Rehab Pain medications as needed  *Acute kidney injury over CKD stage III.   -pt was on IV lasix, lisinopril, poor po intake and CT chest with contrast -hold nephrotoxins -start IVF -UOP 350cc -Nephrology consult with Dr Juleen China  *Chronic leukocytosis is stable  *  Left distal radial fracture - Splint applied in the emergency room  *  Urinary tract infection -UC was not sent - IV Rocephin x3 days--completed  *  Dementia - Aricept and Namenda continued  *Acute blood loss anemia status post surgery.  Monitor.  * DVT and PPI prophylaxis  CODE STATUS: FULL CODE  TOTAL CC TIME TAKING CARE OF THIS PATIENT: 35 minutes.   POSSIBLE D/C IN fewDAYS, DEPENDING ON CLINICAL CONDITION.  Holly Burns M.D on 10/02/2018 at 1:38 PM  Between 7am to 6pm - Pager - [phone] After 6pm go to [email or web address] - password [email or web address] [facility] [facility]  Office  [phone]  CC: Primary care physician; System, Pcp Not In  Note: This dictation was prepared with Dragon dictation along with smaller phrase technology. Any transcriptional errors that result from this process are unintentional.

## 2018-10-02 NOTE — Progress Notes (Signed)
Talked to Dr. Posey Pronto about patient's congestion and rhonchii in her lung sound, asked if she can have expectorant, order given. Patient is currently receiving 1 unit of PRBC and IVF, asked if she can have lasix, no orders given. RN will continue to monitor.

## 2018-10-03 LAB — BPAM RBC
Blood Product Expiration Date: 202006252359
Blood Product Expiration Date: 202006292359
ISSUE DATE / TIME: 202006201726
Unit Type and Rh: 5100
Unit Type and Rh: 6200

## 2018-10-03 LAB — TYPE AND SCREEN
ABO/RH(D): A POS
Antibody Screen: POSITIVE
Donor AG Type: NEGATIVE
Unit division: 0
Unit division: 0

## 2018-10-03 LAB — HEMOGLOBIN AND HEMATOCRIT, BLOOD
HCT: 28.2 % — ABNORMAL LOW (ref 36.0–46.0)
Hemoglobin: 9.6 g/dL — ABNORMAL LOW (ref 12.0–15.0)

## 2018-10-03 LAB — BASIC METABOLIC PANEL
Anion gap: 10 (ref 5–15)
BUN: 89 mg/dL — ABNORMAL HIGH (ref 8–23)
CO2: 23 mmol/L (ref 22–32)
Calcium: 8.3 mg/dL — ABNORMAL LOW (ref 8.9–10.3)
Chloride: 107 mmol/L (ref 98–111)
Creatinine, Ser: 3.45 mg/dL — ABNORMAL HIGH (ref 0.44–1.00)
GFR calc Af Amer: 13 mL/min — ABNORMAL LOW (ref >60)
GFR calc non Af Amer: 11 mL/min — ABNORMAL LOW (ref >60)
Glucose, Bld: 147 mg/dL — ABNORMAL HIGH (ref 70–99)
Potassium: 4.9 mmol/L (ref 3.5–5.1)
Sodium: 140 mmol/L (ref 135–145)

## 2018-10-03 NOTE — Progress Notes (Signed)
[facility]  ROUNDING NOTE   Subjective:   UOP 450   Creatinine 3.45 (4.05)  Objective:  Vital signs in last 24 hours:  Temp:  [97.9 F (36.6 C)-99.6 F (37.6 C)] 98.4 F (36.9 C) (06/21 1050) Pulse Rate:  [67-89] 84 (06/21 1054) Resp:  [18-19] 18 (06/21 1050) BP: (79-145)/(35-60) 145/60 (06/21 1050) SpO2:  [88 %-98 %] 94 % (06/21 1054)  Weight change:  Filed Weights   09/29/18 1748 10/02/18 0450  Weight: 55 kg 38.1 kg    Intake/Output: I/O last 3 completed shifts: In: 755.5 [I.V.:755.5] Out: 550 [Urine:550]   Intake/Output this shift:  Total I/O In: 240 [P.O.:240] Out: 700 [Urine:700]  Physical Exam: General: NAD,   Head: Normocephalic, atraumatic. Dry oral mucosal membranes  Eyes: Anicteric, PERRL  Neck: Supple, trachea midline  Lungs:  Clear to auscultation  Heart: Regular rate and rhythm  Abdomen:  Soft, nontender,   Extremities: no peripheral edema.  Neurologic: Not able to follow commands  Skin: No lesions       Basic Metabolic Panel: Recent Labs  Lab 09/29/18 1903 09/30/18 0434 10/01/18 0922 10/02/18 0620 10/03/18 0630  NA 142 144 139 140 140  K 3.8 4.3 4.0 4.7 4.9  CL 104 105 105 105 107  CO2 21* 25 22 23 23   GLUCOSE 280* 179* 175* 157* 147*  BUN 30* 37* 58* 78* 89*  CREATININE 1.33* 1.77* 3.17* 4.05* 3.45*  CALCIUM 9.0 8.8* 8.0* 8.2* 8.3*    Liver Function Tests: Recent Labs  Lab 09/29/18 1903  AST 32  ALT 17  ALKPHOS 42  BILITOT 1.7*  PROT 6.8  ALBUMIN 4.2   No results for input(s): LIPASE, AMYLASE in the last 168 hours. No results for input(s): AMMONIA in the last 168 hours.  CBC: Recent Labs  Lab 09/29/18 1801 09/30/18 0434 10/01/18 0922 10/02/18 0620 10/03/18 0830  WBC 21.1* 21.0* 20.6* 23.5*  --   NEUTROABS 18.9*  --   --   --   --   HGB 11.3* 10.2* 8.0* 7.6* 9.6*  HCT 34.6* 31.1* 24.3* 23.7* 28.2*  MCV 95.8 95.7 98.0 98.3  --   PLT 296 281 257 307  --     Cardiac Enzymes: Recent Labs  Lab  09/29/18 1903  TROPONINI <0.03    BNP: Invalid input(s): POCBNP  CBG: No results for input(s): GLUCAP in the last 168 hours.  Microbiology: Results for orders placed or performed during the hospital encounter of 09/29/18  SARS Coronavirus 2 (CEPHEID - Performed in [place] hospital lab), Hosp Order     Status: None   Collection Time: 09/29/18  7:04 PM   Specimen: Nasopharyngeal Swab  Result Value Ref Range Status   SARS Coronavirus 2 NEGATIVE NEGATIVE Final    Comment: (NOTE) If result is NEGATIVE SARS-CoV-2 target nucleic acids are NOT DETECTED. The SARS-CoV-2 RNA is generally detectable in upper and lower  respiratory specimens during the acute phase of infection. The lowest  concentration of SARS-CoV-2 viral copies this assay can detect is 250  copies / mL. A negative result does not preclude SARS-CoV-2 infection  and should not be used as the sole basis for treatment or other  patient management decisions.  A negative result may occur with  improper specimen collection / handling, submission of specimen other  than nasopharyngeal swab, presence of viral mutation(s) within the  areas targeted by this assay, and inadequate number of viral copies  (<250 copies / mL). A negative result must  be combined with clinical  observations, patient history, and epidemiological information. If result is POSITIVE SARS-CoV-2 target nucleic acids are DETECTED. The SARS-CoV-2 RNA is generally detectable in upper and lower  respiratory specimens dur ing the acute phase of infection.  Positive  results are indicative of active infection with SARS-CoV-2.  Clinical  correlation with patient history and other diagnostic information is  necessary to determine patient infection status.  Positive results do  not rule out bacterial infection or co-infection with other viruses. If result is PRESUMPTIVE POSTIVE SARS-CoV-2 nucleic acids MAY BE PRESENT.   A presumptive positive result was obtained  on the submitted specimen  and confirmed on repeat testing.  While 2019 novel coronavirus  (SARS-CoV-2) nucleic acids may be present in the submitted sample  additional confirmatory testing may be necessary for epidemiological  and / or clinical management purposes  to differentiate between  SARS-CoV-2 and other Sarbecovirus currently known to infect humans.  If clinically indicated additional testing with an alternate test  methodology [phone]) is advised. The SARS-CoV-2 RNA is generally  detectable in upper and lower respiratory sp ecimens during the acute  phase of infection. The expected result is Negative. Fact Sheet for Patients:  StrictlyIdeas.no Fact Sheet for Healthcare Providers: BankingDealers.co.za This test is not yet approved or cleared by the [place] FDA and has been authorized for detection and/or diagnosis of SARS-CoV-2 by FDA under an Emergency Use Authorization (EUA).  This EUA will remain in effect (meaning this test can be used) for the duration of the COVID-19 declaration under Section 564(b)(1) of the Act, 21 U.S.C. section 360bbb-3(b)(1), unless the authorization is terminated or revoked sooner. Performed at [facility], [address], [place], [place] [address]   Surgical pcr screen     Status: None   Collection Time: 09/30/18  1:14 AM   Specimen: Nasal Mucosa; Nasal Swab  Result Value Ref Range Status   MRSA, PCR NEGATIVE NEGATIVE Final   Staphylococcus aureus NEGATIVE NEGATIVE Final    Comment: (NOTE) The Xpert SA Assay (FDA approved for NASAL specimens in patients 22 years of age and older), is one component of a comprehensive surveillance program. It is not intended to diagnose infection nor to guide or monitor treatment. Performed at [facility], [address] [place], [place] [address]     Coagulation Studies: No results for input(s): LABPROT, INR in the last 72  hours.  Urinalysis: No results for input(s): COLORURINE, LABSPEC, PHURINE, GLUCOSEU, HGBUR, BILIRUBINUR, KETONESUR, PROTEINUR, UROBILINOGEN, NITRITE, LEUKOCYTESUR in the last 72 hours.  Invalid input(s): APPERANCEUR    Imaging: No results found.   Medications:   . methocarbamol (ROBAXIN) IV     . apixaban  2.5 mg Oral BID  . diltiazem  240 mg Oral Daily  . docusate sodium  100 mg Oral BID  . donepezil  10 mg Oral QHS  . escitalopram  10 mg Oral BID  . feeding supplement (ENSURE ENLIVE)  237 mL Oral BID BM  . ferrous [id number] C-folic acid  1 capsule Oral BID WC  . guaiFENesin  600 mg Oral BID  . levothyroxine  75 mcg Oral QAC breakfast  . lidocaine  1 patch Transdermal Q24H  . memantine  5 mg Oral BID  . metoprolol succinate  50 mg Oral Daily  . multivitamin with minerals  1 tablet Oral Daily  . pantoprazole  40 mg Oral Daily  . simvastatin  10 mg Oral QHS  . vitamin C  1,000 mg  Oral Daily   acetaminophen, bisacodyl, diphenhydrAMINE, methocarbamol **OR** methocarbamol (ROBAXIN) IV, metoCLOPramide **OR** metoCLOPramide (REGLAN) injection, ondansetron **OR** ondansetron (ZOFRAN) IV, polyethylene glycol, senna-docusate, traMADol  Assessment/ Plan:  Ms. Holly Burns is a 85 y.o. white female Ms. Holly Burns is a 85 y.o. white female with hypertension, hyperlipidemia, congestive heart failure, depression, , who was admitted to [facility] on 09/29/2018 for fall with rib fractures, left hip fracture and closed left wrist fracture. Status post hip surgery on 6/18 by Dr. Rudene Christians.  1. Acute renal failure on chronic kidney disease stage III: Baseline creatinine of 1.4, GFR of 33 on 03/17/2018.  Bland urine.  Chronic Kidney Disease is secondary to hypertension Acute renal failure secondary to IV contrast nephropathy. CTA of chest on 6/17.  - IV fluids: NS at 58mL/hr - Holding home regimen of ramipril and furosemide.  - Monitor volume status.   2. Hypertension: on  diltazem and metoprolol  3. Urinary tract infection: completed course of ceftriaxone.    LOS: 4 Ormand Senn 6/21/202012:49 PM

## 2018-10-03 NOTE — Progress Notes (Signed)
   Subjective: 3 Days Post-Op Procedure(s) (LRB): INTRAMEDULLARY (IM) NAIL INTERTROCHANTRIC (Left) CLOSED REDUCTION WRIST (Left) Patient reports pain as moderate.   Patient is well, and has had no acute complaints or problems Denies any CP, SOB, ABD pain. We will continue physical therapy today.   Objective: Vital signs in last 24 hours: Temp:  [97.9 F (36.6 C)-99.6 F (37.6 C)] 98.4 F (36.9 C) (06/21 0419) Pulse Rate:  [67-89] 67 (06/21 0421) Resp:  [18-20] 18 (06/21 0419) BP: (79-137)/(35-53) 137/53 (06/21 0421) SpO2:  [90 %-98 %] 97 % (06/21 0419)  Intake/Output from previous day: 06/20 0701 - 06/21 0700 In: 755.5 [I.V.:755.5] Out: 450 [Urine:450] Intake/Output this shift: No intake/output data recorded.  Recent Labs    10/01/18 0922 10/02/18 0620  HGB 8.0* 7.6*   Recent Labs    10/01/18 0922 10/02/18 0620  WBC 20.6* 23.5*  RBC 2.48* 2.41*  HCT 24.3* 23.7*  PLT 257 307   Recent Labs    10/02/18 0620 10/03/18 0630  NA 140 140  K 4.7 4.9  CL 105 107  CO2 23 23  BUN 78* 89*  CREATININE 4.05* 3.45*  GLUCOSE 157* 147*  CALCIUM 8.2* 8.3*   No results for input(s): LABPT, INR in the last 72 hours.  EXAM General - Patient is Alert, Appropriate and Oriented Left lower extremity - Neurovascular intact Sensation intact distally Intact pulses distally Dorsiflexion/Plantar flexion intact No cellulitis present Compartment soft Dressing - dressing C/D/I and no drainage  Left upper extremity -cast intact, minimal ecchymosis along the thumb.  No significant swelling throughout the digits. Motor Function - intact, moving foot and toes well on exam.   Past Medical History:  Diagnosis Date  . CHF (congestive heart failure) ([place])   . Depression   . GERD (gastroesophageal reflux disease)   . Hyperlipidemia   . Hypertension   . Thyroid disease     Assessment/Plan:   3 Days Post-Op Procedure(s) (LRB): INTRAMEDULLARY (IM) NAIL INTERTROCHANTRIC  (Left) CLOSED REDUCTION WRIST (Left) Active Problems:   Intertrochanteric fracture, closed, left, initial encounter ([place])   Fall  Estimated body mass index is 15.36 kg/m as calculated from the following:   Height as of this encounter: 5\' 2"  (1.575 m).   Weight as of this encounter: 38.1 kg. Advance diet Up with therapy  Needs bowel movement Acute post op blood loss anemia -hemoglobin pending this morning.  The patient received 1 unit of transfused packed red blood cells yesterday.  Continue iron supplementation.    DVT Prophylaxis - TED hose and SCDs, Eliquis Weight-Bearing as tolerated to left lower extremity, nonweightbearing left upper extremity  Reche Dixon, PA-C [place] 10/03/2018, 7:49 AM

## 2018-10-03 NOTE — Progress Notes (Addendum)
Spoke to patient daughter to give update, assisted patient to talk on phone with daughter. Patient weaned down to 2 Liters via HFNC of oxygen.

## 2018-10-03 NOTE — Plan of Care (Signed)
  Problem: Education: Goal: Knowledge of General Education information will improve Description: Including pain rating scale, medication(s)/side effects and non-pharmacologic comfort measures Outcome: Progressing   Problem: Clinical Measurements: Goal: Respiratory complications will improve Outcome: Progressing Goal: Cardiovascular complication will be avoided Outcome: Progressing   Problem: Safety: Goal: Ability to remain free from injury will improve Outcome: Progressing   Problem: Skin Integrity: Goal: Risk for impaired skin integrity will decrease Outcome: Progressing   

## 2018-10-03 NOTE — Progress Notes (Signed)
Holly Burns at [place] NAME: Holly Burns    MR#:  [id number]  DATE OF BIRTH:  04/04/1925  SUBJECTIVE:   Still has some left-sided chest pain with her rib fractures. No new issues per RN. More awake  REVIEW OF SYSTEMS:    Review of Systems  Constitutional: Positive for malaise/fatigue. Negative for chills and fever.  HENT: Negative for sore throat.   Eyes: Negative for blurred vision, double vision and pain.  Respiratory: Negative for cough, hemoptysis, shortness of breath and wheezing.   Cardiovascular: Positive for chest pain. Negative for palpitations, orthopnea and leg swelling.  Gastrointestinal: Negative for abdominal pain, constipation, diarrhea, heartburn, nausea and vomiting.  Genitourinary: Negative for dysuria and hematuria.  Musculoskeletal: Negative for back pain and joint pain.  Skin: Negative for rash.  Neurological: Negative for sensory change, speech change, focal weakness and headaches.  Endo/Heme/Allergies: Does not bruise/bleed easily.  Psychiatric/Behavioral: Negative for depression. The patient is not nervous/anxious.     DRUG ALLERGIES:   Allergies  Allergen Reactions  . Codeine Other (See Comments)    Reaction: unknown  . Penicillins Other (See Comments)    Unknown reaction and unable to answer follow-up questions  . Phenytoin Sodium Extended Other (See Comments)    Hyperthermia    VITALS:  Blood pressure (!) 145/60, pulse 84, temperature 98.4 F (36.9 C), temperature source Oral, resp. rate 18, height 5\' 2"  (1.575 m), weight 38.1 kg, SpO2 94 %.  PHYSICAL EXAMINATION:   Physical Exam  GENERAL:  85 y.o.-year-old patient lying in the bed with no acute distress. Holly Burns, weak EYES: Pupils equal, round, reactive to light and accommodation. No scleral icterus. Extraocular muscles intact.  HEENT: Head atraumatic, normocephalic. Oropharynx and nasopharynx clear.dry mucosa NECK:  Supple, no jugular venous  distention. No thyroid enlargement, no tenderness.  LUNGS: Normal breath sounds bilaterally, no wheezing, rales, rhonchi. No use of accessory muscles of respiration.  CARDIOVASCULAR: Irregularly irregular and tachycardic ABDOMEN: Soft, nontender, nondistended. Bowel sounds present. No organomegaly or mass.  EXTREMITIES: No cyanosis, clubbing or edema b/l.    NEUROLOGIC: unable to assess in detail. Grossly nonfocal PSYCHIATRIC: patient is sleepy SKIN: No obvious rash, lesion, or ulcer.   LABORATORY PANEL:   CBC Recent Labs  Lab 10/02/18 0620 10/03/18 0830  WBC 23.5*  --   HGB 7.6* 9.6*  HCT 23.7* 28.2*  PLT 307  --    ------------------------------------------------------------------------------------------------------------------ Chemistries  Recent Labs  Lab 09/29/18 1903  10/03/18 0630  NA 142   < > 140  K 3.8   < > 4.9  CL 104   < > 107  CO2 21*   < > 23  GLUCOSE 280*   < > 147*  BUN 30*   < > 89*  CREATININE 1.33*   < > 3.45*  CALCIUM 9.0   < > 8.3*  AST 32  --   --   ALT 17  --   --   ALKPHOS 42  --   --   BILITOT 1.7*  --   --    < > = values in this interval not displayed.   ------------------------------------------------------------------------------------------------------------------  Cardiac Enzymes Recent Labs  Lab 09/29/18 1903  TROPONINI <0.03   ------------------------------------------------------------------------------------------------------------------  RADIOLOGY:  No results found.   ASSESSMENT AND PLAN:   *Atrial fibrillation with rapid ventricular rate On metoprolol and Cardizem p.o.  Monitor on telemetry. Appreciate cardiology input. -HR in the 70's  *  Left intertrochanteric fracture Status  post surgery Postop day 3 PT consulted--Rehab Pain medications as needed  *Acute kidney injury over CKD stage III.  (due to HTN) -pt was on IV lasix, lisinopril, poor po intake and CT chest with contrast -hold  nephrotoxins -cret1.7--3.10--4.05--IVF--3.45 Cont  IVF -UOP 450cc -Nephrology consult with Dr Juleen China appreciated  *Chronic leukocytosis is stable  *  Left distal radial fracture - Splint applied in the emergency room  *  Urinary tract infection -UC was not sent - IV Rocephin x3 days--completed  *  Dementia - Aricept and Namenda continued  *Acute blood loss anemia status post surgery.  -s/p 1 unit BT  spoke with patient's son Holly Burns on the phone and updated.  CODE STATUS: FULL CODE  TOTAL CC TIME TAKING CARE OF THIS PATIENT: 35 minutes.   POSSIBLE D/C IN fewDAYS, DEPENDING ON CLINICAL CONDITION.  Holly Burns M.D on 10/03/2018 at 1:35 PM  Between 7am to 6pm - Pager - [phone] After 6pm go to [email or web address] - password [email or web address] [facility]  Office  [phone]  CC: Primary care physician; System, Pcp Not In  Note: This dictation was prepared with Dragon dictation along with smaller phrase technology. Any transcriptional errors that result from this process are unintentional.

## 2018-10-03 NOTE — Progress Notes (Signed)
PT Cancellation Note  Patient Details Name: Holly Burns MRN: [id number] DOB: 11-26-1924   Cancelled Treatment:    Reason Eval/Treat Not Completed: Fatigue/lethargy limiting ability to participate   Pt in bed, eyes closed and leaning to right with head in a poor position looking generally uncomfortable.  Pt stated she did not feel good and her stomach was upset.  Pt agreed to repositioning in bed and was centered more in the bed with pillow supports for neck alignment.  Session held at this time as she was unable to tolerate further activity.   Chesley Noon 10/03/2018, 1:28 PM

## 2018-10-04 LAB — BASIC METABOLIC PANEL
Anion gap: 10 (ref 5–15)
BUN: 73 mg/dL — ABNORMAL HIGH (ref 8–23)
CO2: 26 mmol/L (ref 22–32)
Calcium: 9 mg/dL (ref 8.9–10.3)
Chloride: 107 mmol/L (ref 98–111)
Creatinine, Ser: 1.94 mg/dL — ABNORMAL HIGH (ref 0.44–1.00)
GFR calc Af Amer: 26 mL/min — ABNORMAL LOW (ref >60)
GFR calc non Af Amer: 22 mL/min — ABNORMAL LOW (ref >60)
Glucose, Bld: 118 mg/dL — ABNORMAL HIGH (ref 70–99)
Potassium: 4.6 mmol/L (ref 3.5–5.1)
Sodium: 143 mmol/L (ref 135–145)

## 2018-10-04 LAB — CBC
HCT: 31.5 % — ABNORMAL LOW (ref 36.0–46.0)
Hemoglobin: 10.4 g/dL — ABNORMAL LOW (ref 12.0–15.0)
MCH: 32 pg (ref 26.0–34.0)
MCHC: 33 g/dL (ref 30.0–36.0)
MCV: 96.9 fL (ref 80.0–100.0)
Platelets: 336 10*3/uL (ref 150–400)
RBC: 3.25 MIL/uL — ABNORMAL LOW (ref 3.87–5.11)
RDW: 16 % — ABNORMAL HIGH (ref 11.5–15.5)
WBC: 15.5 10*3/uL — ABNORMAL HIGH (ref 4.0–10.5)
nRBC: 0.2 % (ref 0.0–0.2)

## 2018-10-04 MED ORDER — ENSURE ENLIVE PO LIQD: 237.0000 mL | Freq: Two times a day (BID) | ORAL | 12 refills | Status: AC

## 2018-10-04 MED ORDER — FE FUMARATE-B12-VIT C-FA-IFC PO CAPS: 1.0000 | ORAL_CAPSULE | Freq: Two times a day (BID) | ORAL | 1 refills | Status: AC

## 2018-10-04 NOTE — Progress Notes (Addendum)
Report called to [place] at [place], and transport called. Daughter Webb Silversmith called and updated. Webb Silversmith to update patient son, [name].

## 2018-10-04 NOTE — Progress Notes (Signed)
[facility] [place] [place] NAME: Holly Burns    MR#:  [id number]  DATE OF BIRTH:  Aug 23, 1924  SUBJECTIVE:    No new issues per RN. More awake. Having a better day today  REVIEW OF SYSTEMS:    Review of Systems  Constitutional: Positive for malaise/fatigue. Negative for chills and fever.  HENT: Negative for sore throat.   Eyes: Negative for blurred vision, double vision and pain.  Respiratory: Negative for cough, hemoptysis, shortness of breath and wheezing.   Cardiovascular: Negative for palpitations, orthopnea and leg swelling.  Gastrointestinal: Negative for abdominal pain, constipation, diarrhea, heartburn, nausea and vomiting.  Genitourinary: Negative for dysuria and hematuria.  Musculoskeletal: Negative for back pain and joint pain.  Skin: Negative for rash.  Neurological: Negative for sensory change, speech change, focal weakness and headaches.  Endo/Heme/Allergies: Does not bruise/bleed easily.  Psychiatric/Behavioral: Negative for depression. The patient is not nervous/anxious.     DRUG ALLERGIES:   Allergies  Allergen Reactions  . Codeine Other (See Comments)    Reaction: unknown  . Penicillins Other (See Comments)    Unknown reaction and unable to answer follow-up questions  . Phenytoin Sodium Extended Other (See Comments)    Hyperthermia    VITALS:  Blood pressure (!) 179/65, pulse 95, temperature (!) 97.5 F (36.4 C), temperature source Oral, resp. rate 20, height 5\' 2"  (1.575 m), weight 38.1 kg, SpO2 95 %.  PHYSICAL EXAMINATION:   Physical Exam  GENERAL:  85 y.o.-year-old patient lying in the bed with no acute distress. Holly Burns, weak EYES: Pupils equal, round, reactive to light and accommodation. No scleral icterus. Extraocular muscles intact.  HEENT: Head atraumatic, normocephalic. Oropharynx and nasopharynx clear.dry mucosa NECK:  Supple, no jugular venous distention. No thyroid enlargement, no tenderness.   LUNGS: Normal breath sounds bilaterally, no wheezing, rales, rhonchi. No use of accessory muscles of respiration.  CARDIOVASCULAR: Irregularly irregular and tachycardic ABDOMEN: Soft, nontender, nondistended. Bowel sounds present. No organomegaly or mass.  EXTREMITIES: No cyanosis, clubbing or edema b/l.    NEUROLOGIC: unable to assess in detail. Grossly nonfocal PSYCHIATRIC: patient is sleepy SKIN: No obvious rash, lesion, or ulcer.   LABORATORY PANEL:   CBC Recent Labs  Lab 10/04/18 1301  WBC 15.5*  HGB 10.4*  HCT 31.5*  PLT 336   ------------------------------------------------------------------------------------------------------------------ Chemistries  Recent Labs  Lab 09/29/18 1903  10/04/18 1301  NA 142   < > 143  K 3.8   < > 4.6  CL 104   < > 107  CO2 21*   < > 26  GLUCOSE 280*   < > 118*  BUN 30*   < > 73*  CREATININE 1.33*   < > 1.94*  CALCIUM 9.0   < > 9.0  AST 32  --   --   ALT 17  --   --   ALKPHOS 42  --   --   BILITOT 1.7*  --   --    < > = values in this interval not displayed.   ------------------------------------------------------------------------------------------------------------------  Cardiac Enzymes Recent Labs  Lab 09/29/18 1903  TROPONINI <0.03   ------------------------------------------------------------------------------------------------------------------  RADIOLOGY:  No results found.   ASSESSMENT AND PLAN:   *Atrial fibrillation with rapid ventricular rate On metoprolol and Cardizem p.o.  Monitor on telemetry. Appreciate cardiology input. -HR in the 70's  *  Left intertrochanteric fracture Status post surgery Postop day 4 PT consulted--Rehab Pain medications as needed  *Acute kidney injury over CKD  stage III.  (due to HTN) -pt was on IV lasix, lisinopril, poor po intake and CT chest with contrast -hold nephrotoxins -cret1.7--3.10--4.05--IVF--3.45--1.9 -good UOP -d/c  IVF -Nephrology consult with Dr Candiss Norse  appreciated  *Chronic leukocytosis is stable  *  Left distal radial fracture - Splint applied in the emergency room  *  Urinary tract infection -UC was not sent - IV Rocephin x3 days--completed  *  Dementia - Aricept and Namenda continued  *Acute blood loss anemia status post surgery.  -s/p 1 unit BT  spoke with patient's son Holly Burns on the phone and updated. Patient will discharged to rehab when bed available. Son in agreement  CODE STATUS: FULL CODE  TOTAL CC TIME TAKING CARE OF THIS PATIENT: 35 minutes.     Fritzi Mandes M.D on 10/04/2018 at 3:30 PM  Between 7am to 6pm - Pager - [phone] After 6pm go to [email or web address] - password [email or web address] [place]  Office  [phone]  CC: Primary care physician; System, Pcp Not In  Note: This dictation was prepared with Dragon dictation along with smaller phrase technology. Any transcriptional errors that result from this process are unintentional.

## 2018-10-04 NOTE — TOC Transition Note (Signed)
Transition of Care [facility]) - CM/SW Discharge Note   Patient Details  Name: Holly Burns MRN: [id number] Date of Birth: 23-Jun-1924  Transition of Care [facility]) CM/SW Contact:  Elza Rafter, RN Phone Number: 10/04/2018, 4:16 PM   Clinical Narrative:   Patient discharging to [facility] today via EMS.  She will go to room E10.  Packet placed on chart.      Final next level of care: Skilled Nursing Facility Barriers to Discharge: No Barriers Identified   Patient Goals and CMS Choice        Discharge Placement                       Discharge Plan and Services                                     Social Determinants of Health (SDOH) Interventions     Readmission Risk Interventions No flowsheet data found.

## 2018-10-04 NOTE — Plan of Care (Signed)
  Problem: Clinical Measurements: Goal: Ability to maintain clinical measurements within normal limits will improve Outcome: Adequate for Discharge   Problem: Clinical Measurements: Goal: Diagnostic test results will improve Outcome: Adequate for Discharge   Problem: Clinical Measurements: Goal: Respiratory complications will improve Outcome: Adequate for Discharge   Problem: Safety: Goal: Ability to remain free from injury will improve Outcome: Adequate for Discharge

## 2018-10-04 NOTE — Progress Notes (Signed)
Occupational Therapy Treatment Patient Details Name: Holly Burns MRN: [id number] DOB: 1924/06/03 Today's Date: 10/04/2018    History of present illness 85 year old female with PMHx of dementia, CHF, depression, GERD, HLD, HTN admitted with left intertrochanteric fracture, left wrist fracture, A-fib with RVR, UTI. S/p intertrochanteric IM nail LLE and closed reduction L wrist 6/18.   OT comments  Pt. was assisted with breakfast. Pt. is Total assist with self-feeding, drinking, and Total Assist for performing hand-to-mouth patterns. Pt. required total assist for positioning in bed. Pt. continues to benefit from OT services for ADL training, assessing A/E needs, and pt. education about home modification, and DME. Pt. continues to benefit from SNF level of care upon discharge. Pt. could benefit from follow-up OT services at discharge.   Follow Up Recommendations  SNF;Supervision/Assistance - 24 hour    Equipment Recommendations  Other (comment)    Recommendations for Other Services      Precautions / Restrictions Precautions Precautions: Fall Restrictions Weight Bearing Restrictions: Yes LUE Weight Bearing: Non weight bearing LLE Weight Bearing: Weight bearing as tolerated       Mobility Bed Mobility Overal bed mobility: Needs Assistance             General bed mobility comments: deferred  Transfers                 General transfer comment: Deferred.    Balance                                           ADL either performed or assessed with clinical judgement   ADL Overall ADL's : Needs assistance/impaired Eating/Feeding: Total assistance                                           Vision Wears Glasses: At all times     Perception     Praxis      Cognition Arousal/Alertness: Awake/alert Behavior During Therapy: Flat affect Overall Cognitive Status: Difficult to assess Area of Impairment: Awareness                                         Exercises   Shoulder Instructions       General Comments      Pertinent Vitals/ Pain       Pain Assessment: Faces Pain Score: 6  Faces Pain Scale: Hurts even more Pain Location: Pt. moaned at intervals throughout the session Pain Descriptors / Indicators: Grimacing;Sore Pain Intervention(s): Limited activity within patient's tolerance;Monitored during session  Home Living                                          Prior Functioning/Environment              Frequency  Min 2X/week        Progress Toward Goals  OT Goals(current goals can now be found in the care plan section)     Acute Rehab OT Goals Patient Stated Goal: no goals stated (apart from wanting to be in heaven) OT Goal Formulation: Patient unable  to participate in goal setting  Plan Discharge plan remains appropriate    Co-evaluation                 AM-PAC OT "6 Clicks" Daily Activity     Outcome Measure   Help from another person eating meals?: A Little Help from another person taking care of personal grooming?: A Lot Help from another person toileting, which includes using toliet, bedpan, or urinal?: A Lot Help from another person bathing (including washing, rinsing, drying)?: A Lot Help from another person to put on and taking off regular upper body clothing?: A Lot Help from another person to put on and taking off regular lower body clothing?: A Lot 6 Click Score: 13    End of Session    OT Visit Diagnosis: History of falling (Z91.81);Other abnormalities of gait and mobility (R26.89);Pain Pain - Right/Left: Left Pain - part of body: Arm;Hip   Activity Tolerance Patient limited by pain;Patient limited by lethargy   Patient Left     Nurse Communication          Time: 4585-9292 OT Time Calculation (min): 36 min  Charges: OT General Charges $OT Visit: 1 Visit OT Treatments $Self Care/Home Management : 8-22  mins  Harrel Carina, MS, OTR/L    Harrel Carina 10/04/2018, 1:13 PM

## 2018-10-04 NOTE — Plan of Care (Signed)
  Problem: Education: Goal: Knowledge of General Education information will improve Description: Including pain rating scale, medication(s)/side effects and non-pharmacologic comfort measures Outcome: Progressing   Problem: Clinical Measurements: Goal: Diagnostic test results will improve Outcome: Progressing Goal: Respiratory complications will improve Outcome: Progressing   Problem: Pain Managment: Goal: General experience of comfort will improve Outcome: Progressing

## 2018-10-04 NOTE — Progress Notes (Signed)
[facility]  ROUNDING NOTE   Subjective:   UOP is 2650 cc Denies any acute complaints Denies shortness of breath   Objective:  Vital signs in last 24 hours:  Temp:  [97.5 F (36.4 C)-98.5 F (36.9 C)] 97.5 F (36.4 C) (06/22 0819) Pulse Rate:  [69-105] 105 (06/22 0819) Resp:  [18-20] 20 (06/22 0819) BP: (115-179)/(34-65) 179/65 (06/22 0819) SpO2:  [94 %-100 %] 96 % (06/22 0819)  Weight change:  Filed Weights   09/29/18 1748 10/02/18 0450  Weight: 55 kg 38.1 kg    Intake/Output: I/O last 3 completed shifts: In: 1055.5 [P.O.:300; I.V.:755.5] Out: 3100 [Urine:3100]   Intake/Output this shift:  No intake/output data recorded.  Physical Exam: General: NAD,   Head: Normocephalic, atraumatic. Dry oral mucosal membranes  Eyes: Anicteric,  Neck: Supple,   Lungs:  Clear to auscultation  Heart:  Irregular rhythm  Abdomen:  Soft, nontender,   Extremities: no peripheral edema.  Neurologic:  Lethargic but able to answer questions  Skin: No lesions       Basic Metabolic Panel: Recent Labs  Lab 09/29/18 1903 09/30/18 0434 10/01/18 0922 10/02/18 0620 10/03/18 0630  NA 142 144 139 140 140  K 3.8 4.3 4.0 4.7 4.9  CL 104 105 105 105 107  CO2 21* 25 22 23 23   GLUCOSE 280* 179* 175* 157* 147*  BUN 30* 37* 58* 78* 89*  CREATININE 1.33* 1.77* 3.17* 4.05* 3.45*  CALCIUM 9.0 8.8* 8.0* 8.2* 8.3*    Liver Function Tests: Recent Labs  Lab 09/29/18 1903  AST 32  ALT 17  ALKPHOS 42  BILITOT 1.7*  PROT 6.8  ALBUMIN 4.2   No results for input(s): LIPASE, AMYLASE in the last 168 hours. No results for input(s): AMMONIA in the last 168 hours.  CBC: Recent Labs  Lab 09/29/18 1801 09/30/18 0434 10/01/18 0922 10/02/18 0620 10/03/18 0830  WBC 21.1* 21.0* 20.6* 23.5*  --   NEUTROABS 18.9*  --   --   --   --   HGB 11.3* 10.2* 8.0* 7.6* 9.6*  HCT 34.6* 31.1* 24.3* 23.7* 28.2*  MCV 95.8 95.7 98.0 98.3  --   PLT 296 281 257 307  --     Cardiac  Enzymes: Recent Labs  Lab 09/29/18 1903  TROPONINI <0.03    BNP: Invalid input(s): POCBNP  CBG: No results for input(s): GLUCAP in the last 168 hours.  Microbiology: Results for orders placed or performed during the hospital encounter of 09/29/18  SARS Coronavirus 2 (CEPHEID - Performed in [place] hospital lab), Hosp Order     Status: None   Collection Time: 09/29/18  7:04 PM   Specimen: Nasopharyngeal Swab  Result Value Ref Range Status   SARS Coronavirus 2 NEGATIVE NEGATIVE Final    Comment: (NOTE) If result is NEGATIVE SARS-CoV-2 target nucleic acids are NOT DETECTED. The SARS-CoV-2 RNA is generally detectable in upper and lower  respiratory specimens during the acute phase of infection. The lowest  concentration of SARS-CoV-2 viral copies this assay can detect is 250  copies / mL. A negative result does not preclude SARS-CoV-2 infection  and should not be used as the sole basis for treatment or other  patient management decisions.  A negative result may occur with  improper specimen collection / handling, submission of specimen other  than nasopharyngeal swab, presence of viral mutation(s) within the  areas targeted by this assay, and inadequate number of viral copies  (<250 copies / mL). A negative  result must be combined with clinical  observations, patient history, and epidemiological information. If result is POSITIVE SARS-CoV-2 target nucleic acids are DETECTED. The SARS-CoV-2 RNA is generally detectable in upper and lower  respiratory specimens dur ing the acute phase of infection.  Positive  results are indicative of active infection with SARS-CoV-2.  Clinical  correlation with patient history and other diagnostic information is  necessary to determine patient infection status.  Positive results do  not rule out bacterial infection or co-infection with other viruses. If result is PRESUMPTIVE POSTIVE SARS-CoV-2 nucleic acids MAY BE PRESENT.   A presumptive  positive result was obtained on the submitted specimen  and confirmed on repeat testing.  While 2019 novel coronavirus  (SARS-CoV-2) nucleic acids may be present in the submitted sample  additional confirmatory testing may be necessary for epidemiological  and / or clinical management purposes  to differentiate between  SARS-CoV-2 and other Sarbecovirus currently known to infect humans.  If clinically indicated additional testing with an alternate test  methodology [phone]) is advised. The SARS-CoV-2 RNA is generally  detectable in upper and lower respiratory sp ecimens during the acute  phase of infection. The expected result is Negative. Fact Sheet for Patients:  StrictlyIdeas.no Fact Sheet for Healthcare Providers: BankingDealers.co.za This test is not yet approved or cleared by the [place] FDA and has been authorized for detection and/or diagnosis of SARS-CoV-2 by FDA under an Emergency Use Authorization (EUA).  This EUA will remain in effect (meaning this test can be used) for the duration of the COVID-19 declaration under Section 564(b)(1) of the Act, 21 U.S.C. section 360bbb-3(b)(1), unless the authorization is terminated or revoked sooner. Performed at [facility], [address], [place], [place] [address]   Surgical pcr screen     Status: None   Collection Time: 09/30/18  1:14 AM   Specimen: Nasal Mucosa; Nasal Swab  Result Value Ref Range Status   MRSA, PCR NEGATIVE NEGATIVE Final   Staphylococcus aureus NEGATIVE NEGATIVE Final    Comment: (NOTE) The Xpert SA Assay (FDA approved for NASAL specimens in patients 25 years of age and older), is one component of a comprehensive surveillance program. It is not intended to diagnose infection nor to guide or monitor treatment. Performed at [facility], [address] [place], [place] [address]     Coagulation Studies: No results for input(s):  LABPROT, INR in the last 72 hours.  Urinalysis: No results for input(s): COLORURINE, LABSPEC, PHURINE, GLUCOSEU, HGBUR, BILIRUBINUR, KETONESUR, PROTEINUR, UROBILINOGEN, NITRITE, LEUKOCYTESUR in the last 72 hours.  Invalid input(s): APPERANCEUR    Imaging: No results found.   Medications:   . methocarbamol (ROBAXIN) IV     . apixaban  2.5 mg Oral BID  . diltiazem  240 mg Oral Daily  . docusate sodium  100 mg Oral BID  . donepezil  10 mg Oral QHS  . escitalopram  10 mg Oral BID  . feeding supplement (ENSURE ENLIVE)  237 mL Oral BID BM  . ferrous [id number] C-folic acid  1 capsule Oral BID WC  . guaiFENesin  600 mg Oral BID  . levothyroxine  75 mcg Oral QAC breakfast  . lidocaine  1 patch Transdermal Q24H  . memantine  5 mg Oral BID  . metoprolol succinate  50 mg Oral Daily  . multivitamin with minerals  1 tablet Oral Daily  . pantoprazole  40 mg Oral Daily  . simvastatin  10 mg Oral QHS  . vitamin C  1,000 mg Oral Daily   acetaminophen, bisacodyl, diphenhydrAMINE, methocarbamol **OR** methocarbamol (ROBAXIN) IV, metoCLOPramide **OR** metoCLOPramide (REGLAN) injection, ondansetron **OR** ondansetron (ZOFRAN) IV, polyethylene glycol, senna-docusate, traMADol  Assessment/ Plan:  Ms. JAZZ BIDDY is a 85 y.o. white female Ms. MARGALIT LEECE is a 85 y.o. white female with hypertension, hyperlipidemia, congestive heart failure, depression, , who was admitted to [facility] on 09/29/2018 for fall with rib fractures, left hip fracture and closed left wrist fracture. Status post hip surgery on 6/18 by Dr. Rudene Christians.  1. Acute renal failure on chronic kidney disease stage III: Baseline creatinine of 1.4, GFR of 33 on 03/17/2018.  Chronic Kidney Disease is secondary to hypertension Acute renal failure secondary to IV contrast nephropathy. CTA of chest on 6/17.  - IV fluids: NS at 73mL/hr - Holding home regimen of ramipril and furosemide.  - Monitor volume status.  -Serum  creatinine has improved nicely to 1.94  2. Urinary tract infection: completed course of ceftriaxone.    LOS: 5 Holly Burns 6/22/202010:50 AM

## 2018-10-04 NOTE — Discharge Summary (Signed)
[facility] NAME: Holly Burns    MR#:  [id number]  DATE OF BIRTH:  [birth year]  DATE OF ADMISSION:  09/29/2018 ADMITTING PHYSICIAN: Leim Fabry, MD  DATE OF DISCHARGE: 10/04/2018  PRIMARY CARE PHYSICIAN: System, Pcp Not In    ADMISSION DIAGNOSIS:  Atrial fibrillation with rapid ventricular response (HCC) [I48.91] Atrial fibrillation with RVR (HCC) [I48.91] Closed displaced intertrochanteric fracture of left femur, initial encounter ([place]) [S72.142A] Closed fracture of distal end of left radius, unspecified fracture morphology, initial encounter [S52.502A]  DISCHARGE DIAGNOSIS:  closed displaced enter trochanteric fracture of the left femur status post surgery close fracture of the distal end of left radius status post splint a fib with RVR-- on oral anticoagulation acute on chronic renal failure stage III-- improved  SECONDARY DIAGNOSIS:   Past Medical History:  Diagnosis Date  . CHF (congestive heart failure) ([place])   . Depression   . GERD (gastroesophageal reflux disease)   . Hyperlipidemia   . Hypertension   . Thyroid disease     HOSPITAL COURSE:  Holly Burns  is a 85 y.o. female with a known history of CHF, dementia, hypertension, hyperlipidemia, GERD.  Patient presented to the emergency room via EMS services accompanied by her son at the bedside.  Son reports patient had fallen in the bathroom unable to call for help.  Patient was therefore lying on the floor for approximately 3 hours according to her son.  *Atrial fibrillation with rapid ventricular rate On metoprolol and Cardizem p.o. -cont oral anticoagulation with eliquis Appreciate cardiology input. -HR in the 70's  *Left intertrochanteric fracture Status post surgery Postop day 4 PT consulted--Rehab Pain medications as needed  *Acute kidney injury over CKD stage III.  (due to HTN) -pt was on IV lasix, lisinopril, poor po intake and  CT chest with contrast -hold nephrotoxins -cret1.7--3.10--4.05--IVF--3.45--1.9 -good UOP -d/c  IVF -Nephrology consult with Dr Candiss Norse appreciated -I have held her lisinopril and lasix due to renal failure--defer to PCP at follow up to resume if her creat is stable  *Chronic leukocytosis is stable  * Left distal radial fracture -Splint applied in the emergency room  * Urinary tract infection -UC was not sent -IV Rocephin x3 days--completed  * Dementia -Aricept and Namenda continued  *Acute blood loss anemia status post surgery.  -s/p 1 unit BT  spoke with patient's son Holly Burns on the phone and updated. Patient will discharged to rehab today. Son in agreement CONSULTS OBTAINED:  Treatment Team:  Hessie Knows, MD Lavonia Dana, MD  DRUG ALLERGIES:   Allergies  Allergen Reactions  . Codeine Other (See Comments)    Reaction: unknown  . Penicillins Other (See Comments)    Unknown reaction and unable to answer follow-up questions  . Phenytoin Sodium Extended Other (See Comments)    Hyperthermia    DISCHARGE MEDICATIONS:   Allergies as of 10/04/2018      Reactions   Codeine Other (See Comments)   Reaction: unknown   Penicillins Other (See Comments)   Unknown reaction and unable to answer follow-up questions   Phenytoin Sodium Extended Other (See Comments)   Hyperthermia      Medication List    STOP taking these medications   furosemide 20 MG tablet Commonly known as: Lasix   LORazepam 1 MG tablet Commonly known as: ATIVAN   potassium chloride 10 MEQ CR capsule Commonly known as: MICRO-K   ramipril 5 MG capsule Commonly known as: ALTACE  TAKE these medications   acetaminophen 500 MG tablet Commonly known as: TYLENOL Take 500-1,000 mg by mouth every 6 (six) hours as needed.   diltiazem 240 MG 24 hr capsule Commonly known as: CARDIZEM CD Take 1 capsule (240 mg total) by mouth daily as needed.   donepezil 10 MG tablet Commonly  known as: ARICEPT Take 10 mg by mouth at bedtime.   Eliquis 2.5 MG Tabs tablet Generic drug: apixaban TAKE (1) TABLET TWICE A DAY.   feeding supplement (ENSURE ENLIVE) Liqd Take 237 mLs by mouth 2 (two) times daily between meals. Start taking on: October 05, 2018   ferrous [id number] C-folic acid capsule Commonly known as: TRINSICON / FOLTRIN Take 1 capsule by mouth 2 (two) times daily with a meal.   levothyroxine 75 MCG tablet Commonly known as: Synthroid Take 1 tablet (75 mcg total) by mouth daily before breakfast.   Lexapro 10 MG tablet Generic drug: escitalopram Take 10 mg by mouth 2 (two) times a day.   metoprolol succinate 25 MG 24 hr tablet Commonly known as: TOPROL-XL Take 1 tablet (25 mg total) by mouth daily.   Namenda 10 MG tablet Generic drug: memantine Take 10 mg by mouth 2 (two) times daily.   oxyCODONE 5 MG immediate release tablet Commonly known as: Oxy IR/ROXICODONE Take 1-2 tablets (5-10 mg total) by mouth every 4 (four) hours as needed for severe pain (pain score 7-10).   OXYGEN 2 L.   PriLOSEC 20 MG capsule Generic drug: omeprazole Take 20 mg by mouth daily.   simvastatin 20 MG tablet Commonly known as: ZOCOR Take 1 tablet (20 mg total) by mouth at bedtime.   traMADol 50 MG tablet Commonly known as: ULTRAM Take 1 tablet (50 mg total) by mouth every 6 (six) hours as needed for moderate pain.   vitamin C 500 MG tablet Commonly known as: ASCORBIC ACID Take 1,000 mg by mouth daily.       If you experience worsening of your admission symptoms, develop shortness of breath, life threatening emergency, suicidal or homicidal thoughts you must seek medical attention immediately by calling 911 or calling your MD immediately  if symptoms less severe.  You Must read complete instructions/literature along with all the possible adverse reactions/side effects for all the Medicines you take and that have been prescribed to you. Take any new  Medicines after you have completely understood and accept all the possible adverse reactions/side effects.   Please note  You were cared for by a hospitalist during your hospital stay. If you have any questions about your discharge medications or the care you received while you were in the hospital after you are discharged, you can call the unit and asked to speak with the hospitalist on call if the hospitalist that took care of you is not available. Once you are discharged, your primary care physician will handle any further medical issues. Please note that NO REFILLS for any discharge medications will be authorized once you are discharged, as it is imperative that you return to your primary care physician (or establish a relationship with a primary care physician if you do not have one) for your aftercare needs so that they can reassess your need for medications and monitor your lab values.  DATA REVIEW:   CBC  Recent Labs  Lab 10/04/18 1301  WBC 15.5*  HGB 10.4*  HCT 31.5*  PLT 336    Chemistries  Recent Labs  Lab 09/29/18 1903  10/04/18 1301  NA 142   < >  143  K 3.8   < > 4.6  CL 104   < > 107  CO2 21*   < > 26  GLUCOSE 280*   < > 118*  BUN 30*   < > 73*  CREATININE 1.33*   < > 1.94*  CALCIUM 9.0   < > 9.0  AST 32  --   --   ALT 17  --   --   ALKPHOS 42  --   --   BILITOT 1.7*  --   --    < > = values in this interval not displayed.    Microbiology Results   Recent Results (from the past 240 hour(s))  SARS Coronavirus 2 (CEPHEID - Performed in [place] [facility] hospital lab), Hosp Order     Status: None   Collection Time: 09/29/18  7:04 PM   Specimen: Nasopharyngeal Swab  Result Value Ref Range Status   SARS Coronavirus 2 NEGATIVE NEGATIVE Final    Comment: (NOTE) If result is NEGATIVE SARS-CoV-2 target nucleic acids are NOT DETECTED. The SARS-CoV-2 RNA is generally detectable in upper and lower  respiratory specimens during the acute phase of infection. The lowest   concentration of SARS-CoV-2 viral copies this assay can detect is 250  copies / mL. A negative result does not preclude SARS-CoV-2 infection  and should not be used as the sole basis for treatment or other  patient management decisions.  A negative result may occur with  improper specimen collection / handling, submission of specimen other  than nasopharyngeal swab, presence of viral mutation(s) within the  areas targeted by this assay, and inadequate number of viral copies  (<250 copies / mL). A negative result must be combined with clinical  observations, patient history, and epidemiological information. If result is POSITIVE SARS-CoV-2 target nucleic acids are DETECTED. The SARS-CoV-2 RNA is generally detectable in upper and lower  respiratory specimens dur ing the acute phase of infection.  Positive  results are indicative of active infection with SARS-CoV-2.  Clinical  correlation with patient history and other diagnostic information is  necessary to determine patient infection status.  Positive results do  not rule out bacterial infection or co-infection with other viruses. If result is PRESUMPTIVE POSTIVE SARS-CoV-2 nucleic acids MAY BE PRESENT.   A presumptive positive result was obtained on the submitted specimen  and confirmed on repeat testing.  While 2019 novel coronavirus  (SARS-CoV-2) nucleic acids may be present in the submitted sample  additional confirmatory testing may be necessary for epidemiological  and / or clinical management purposes  to differentiate between  SARS-CoV-2 and other Sarbecovirus currently known to infect humans.  If clinically indicated additional testing with an alternate test  methodology [phone]) is advised. The SARS-CoV-2 RNA is generally  detectable in upper and lower respiratory sp ecimens during the acute  phase of infection. The expected result is Negative. Fact Sheet for Patients:  StrictlyIdeas.no Fact Sheet  for Healthcare Providers: BankingDealers.co.za This test is not yet approved or cleared by the [place] FDA and has been authorized for detection and/or diagnosis of SARS-CoV-2 by FDA under an Emergency Use Authorization (EUA).  This EUA will remain in effect (meaning this test can be used) for the duration of the COVID-19 declaration under Section 564(b)(1) of the Act, 21 U.S.C. section 360bbb-3(b)(1), unless the authorization is terminated or revoked sooner. Performed at [facility], [address], [place], [place] [address]   Surgical pcr screen     Status: None  Collection Time: 09/30/18  1:14 AM   Specimen: Nasal Mucosa; Nasal Swab  Result Value Ref Range Status   MRSA, PCR NEGATIVE NEGATIVE Final   Staphylococcus aureus NEGATIVE NEGATIVE Final    Comment: (NOTE) The Xpert SA Assay (FDA approved for NASAL specimens in patients 29 years of age and older), is one component of a comprehensive surveillance program. It is not intended to diagnose infection nor to guide or monitor treatment. Performed at [facility], [address], [place], [place] [address]     RADIOLOGY:  No results found.   CODE STATUS:     Code Status Orders  (From admission, onward)         Start     Ordered   10/01/18 1521  Do not attempt resuscitation (DNR)  Continuous    Question Answer Comment  In the event of cardiac or respiratory ARREST Do not call a "code blue"   In the event of cardiac or respiratory ARREST Do not perform Intubation, CPR, defibrillation or ACLS   In the event of cardiac or respiratory ARREST Use medication by any route, position, wound care, and other measures to relive pain and suffering. May use oxygen, suction and manual treatment of airway obstruction as needed for comfort.      10/01/18 1520        Code Status History    Date Active Date Inactive Code Status Order ID Comments User Context   09/29/2018 2332  10/01/2018 1520 Full Code [id number]  Mayer Camel, NP ED   03/30/2015 0205 04/02/2015 2030 Full Code [id number]  Harrie Foreman, MD Inpatient   Advance Care Planning Activity      TOTAL TIME TAKING CARE OF THIS PATIENT: **40* minutes.    Fritzi Mandes M.D on 10/04/2018 at 3:43 PM  Between 7am to 6pm - Pager - [phone] After 6pm go to [email or web address] - password [email or web address] [facility]  Office  [phone]  CC: Primary care physician; System, Pcp Not In

## 2018-10-04 NOTE — Progress Notes (Signed)
Physical Therapy Treatment Patient Details Name: Holly Burns MRN: [id number] DOB: [birth year] Today's Date: 10/04/2018    History of Present Illness 85 year old female with PMHx of dementia, CHF, depression, GERD, HLD, HTN admitted with left intertrochanteric fracture, left wrist fracture, A-fib with RVR, UTI. S/p intertrochanteric IM nail LLE and closed reduction L wrist 6/18.    PT Comments    Pt asleep but awoke easily and engaged.  P 95, O2 95 on 2 lpm.  Pt stated she was uncomfortable and initially stated she wanted to sit up but it soon became clear that she was unable to tolerate that level of activity at this time.  She shortly closed her eyes and kept repeating "I'm so tired" and "I want to go home."  PROM BLE for comfort and she was repositioned in bed to comfort.  Pt pleasant and cooperative but poor tolerance for mobility or further exercises at this time.  Pt asleep again upon leaving the room.   Follow Up Recommendations  SNF;Supervision/Assistance - 24 hour     Equipment Recommendations  None recommended by PT    Recommendations for Other Services       Precautions / Restrictions Precautions Precautions: Fall Restrictions Weight Bearing Restrictions: Yes LUE Weight Bearing: Non weight bearing LLE Weight Bearing: Weight bearing as tolerated    Mobility  Bed Mobility               General bed mobility comments: deferred  Transfers                 General transfer comment: Deferred.  Ambulation/Gait                 Stairs             Wheelchair Mobility    Modified Rankin (Stroke Patients Only)       Balance                                            Cognition Arousal/Alertness: Lethargic Behavior During Therapy: WFL for tasks assessed/performed Overall Cognitive Status: Difficult to assess                                        Exercises Other Exercises Other Exercises:  supine ROM LLE ankle pumps, heel slides, ab/add and slr x 10    General Comments        Pertinent Vitals/Pain Pain Assessment: Faces Faces Pain Scale: Hurts even more Pain Location: grimmaces with LLE PROM Pain Descriptors / Indicators: Grimacing;Sore Pain Intervention(s): Limited activity within patient's tolerance;Monitored during session;Repositioned    Home Living                      Prior Function            PT Goals (current goals can now be found in the care plan section) Progress towards PT goals: Not progressing toward goals - comment    Frequency    7X/week      PT Plan Current plan remains appropriate    Co-evaluation              AM-PAC PT "6 Clicks" Mobility   Outcome Measure  Help needed turning from your back to your side while  in a flat bed without using bedrails?: Total Help needed moving from lying on your back to sitting on the side of a flat bed without using bedrails?: Total Help needed moving to and from a bed to a chair (including a wheelchair)?: Total Help needed standing up from a chair using your arms (e.g., wheelchair or bedside chair)?: Total Help needed to walk in hospital room?: Total Help needed climbing 3-5 steps with a railing? : Total 6 Click Score: 6    End of Session Equipment Utilized During Treatment: Oxygen Activity Tolerance: Patient limited by fatigue Patient left: with bed alarm set;with call bell/phone within reach;in bed   Pain - Right/Left: Left Pain - part of body: Hip     Time: 7703-4035 PT Time Calculation (min) (ACUTE ONLY): 8 min  Charges:  $Therapeutic Exercise: 8-22 mins                     Chesley Noon, PTA 10/04/18, 11:35 AM

## 2018-10-04 NOTE — Care Management Important Message (Signed)
Important Message  Patient Details  Name: Holly Burns MRN: [id number] Date of Birth: Jun 09, 1924   Medicare Important Message Given:  Yes     Dannette Barbara 10/04/2018, 11:35 AM

## 2018-10-04 NOTE — Progress Notes (Signed)
Patient transferred to [facility] via EMS, daughter notified. No distress, IV removed, tele box returned.

## 2018-10-04 NOTE — Evaluation (Signed)
Clinical/Bedside Swallow Evaluation Patient Details  Name: Holly Burns MRN: [id number] Date of Birth: 1925/02/01  Today's Date: 10/04/2018 Time: SLP Start Time (ACUTE ONLY): 6761 SLP Stop Time (ACUTE ONLY): 0920 SLP Time Calculation (min) (ACUTE ONLY): 22 min  Past Medical History:  Past Medical History:  Diagnosis Date  . CHF (congestive heart failure) ([place])   . Depression   . GERD (gastroesophageal reflux disease)   . Hyperlipidemia   . Hypertension   . Thyroid disease    Past Surgical History:  Past Surgical History:  Procedure Laterality Date  . ABDOMINAL HYSTERECTOMY  1980  . ANGIOPLASTY     due to MI LV ef-40%  . cataract surgery Left   . CLOSED REDUCTION WRIST FRACTURE Left 09/30/2018   Procedure: CLOSED REDUCTION WRIST;  Surgeon: Hessie Knows, MD;  Location: [facility] ORS;  Service: Orthopedics;  Laterality: Left;  . CRANIOTOMY    . 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 female with a known history of CHF, dementia, hypertension, hyperlipidemia, GERD.  Patient presented to the emergency room via EMS services accompanied by her son at the bedside.  Son reports patient had fallen in the bathroom unable to call for help.  Patient was therefore lying on the floor for approximately 3 hours according to her son.  Patient denies loss of consciousness.  However patient has early stages of dementia.  She continues to live alone in an apartment which is near to her son and daughter.  They check on her several times a day.  Patient is currently complaining of pain in her left hip and left wrist.  However, she is a difficult historian given her history of dementia.   Patient's son denies recent illness.  He has noted no recent fevers, chills, nausea, vomiting, diarrhea.  No complaints of chest pain or increased shortness of breath.  He has noted no recent increase in weakness.  Patient usually ambulates from room to room in her home without significant amount of difficulty.   Assessment / Plan / Recommendation Clinical Impression  Upon arrival, pt was being fed breakfast by OT. Received consult for BSE due to nsg report of choking with thin liquids. SLP observed positive s/s aspiration c/b immediate coughing, wet vocal quality and throat clearing following multiple sips thin liquid via straw when offered by OT. Pt appeared in pain, moaning every couple minutes throughout evaluation, pt was lethargic, able to open eyes upon request however unable to keep them open. Pt was able to follow 1 step simple commands. Oral mech exam revealed poor dentition and generalized weakness of all OM structures likely due to lethargy. Pt presents with s/s oropharyngeal dysphagia. Oral phase c/b decreased oral strength and control to form bolus and propel posteriorally with all consistencies, most impaired with thin liquids and soft solids. Pt made weak attempts to masticate soft solid, however demonstrated improved oral control and improved swallow initiation with both puree and nectar thick liquids. Pharyngeal phase c/b multiple swallows, positive s/s aspiration with single sips thin liquids by cup & straw c/b immediate coughing, throat clearing, and wet vocal quality.   Rec. Dysphagia I (PUREE) diet with NECTAR thick liquids for now, give  meds crushed in puree for safer swallowing. Pt will require TOTAL ASSIST with all PO intake, verbal cues for SINGLE SMALL SIPS thickened liquid, NO STRAWS, and be positioned fully upright for all PO intake and remain upright for at least 60 minutes after PO intake. SLP to f/u with toleration of diet, provide education re: safe swallow rec's,  aspiration  precautions, and reflux precautions, and offer trials of upgraded consistency for likely diet upgrade when more alert. SLP Visit Diagnosis: Dysphagia, oropharyngeal phase (R13.12)    Aspiration Risk  Moderate aspiration risk;Risk for inadequate nutrition/hydration    Diet Recommendation Dysphagia 1 (Puree);Nectar-thick liquid   Liquid Administration via: Cup;No straw Medication Administration: Crushed with puree Supervision: Full supervision/cueing for compensatory strategies Compensations: Minimize environmental distractions;Slow rate;Small sips/bites Postural Changes: Seated upright at 90 degrees;Remain upright for at least 30 minutes after po intake    Other  Recommendations Oral Care Recommendations: Oral care QID Other Recommendations: Prohibited food (jello, ice cream, thin soups);Remove water pitcher   Follow up Recommendations Skilled Nursing facility      Frequency and Duration min 2x/week  1 week       Prognosis Prognosis for Safe Diet Advancement: Fair Barriers to Reach Goals: Cognitive deficits;Medication      Swallow Study   General Date of Onset: 10/03/18 HPI: Per admitting H&P: Holly Burns  is a 85 y.o. female with a known history of CHF, dementia, hypertension, hyperlipidemia, GERD.  Patient presented to the emergency room via EMS services accompanied by her son at the bedside.  Son reports patient had fallen in the bathroom unable to call for help.  Patient was therefore lying on the floor for approximately 3 hours according to her son.  Patient denies loss of consciousness.  However patient has early stages of dementia.  She continues to live alone in an apartment which is near to her son and daughter.  They check on her several times a day.  Patient is currently complaining of pain in her left hip and left wrist.  However, she is a difficult historian given her history of dementia.  Patient's son denies recent illness.  He has noted no recent fevers, chills,  nausea, vomiting, diarrhea.  No complaints of chest pain or increased shortness of breath.  He has noted no recent increase in weakness.  Patient usually ambulates from room to room in her home without significant amount of difficulty. Type of Study: Bedside Swallow Evaluation Diet Prior to this Study: Regular;Thin liquids Temperature Spikes Noted: No Respiratory Status: Room air History of Recent Intubation: No Behavior/Cognition: Lethargic/Drowsy;Requires cueing Oral Cavity Assessment: Dry Oral Care Completed by SLP: Other (Comment)(No, was being fed breakfast by OT upon arrival) Oral Cavity - Dentition: Poor condition Self-Feeding Abilities: Total assist Patient Positioning: Upright in bed Baseline Vocal Quality: Hoarse Volitional Cough: Strong Volitional Swallow: Unable to elicit    Oral/Motor/Sensory Function Overall Oral Motor/Sensory Function: Generalized oral weakness Facial Symmetry: Within Functional Limits   Ice Chips Ice chips: Not tested   Thin Liquid Thin Liquid: Impaired Presentation: Cup;Straw Oral Phase Impairments: Reduced labial seal;Reduced lingual movement/coordination;Poor awareness of bolus Oral Phase Functional Implications: Other (comment)(Mod to severe decreased coordination & control for safe oral) Pharyngeal  Phase Impairments: Suspected delayed Swallow;Decreased hyoid-laryngeal movement;Multiple swallows;Wet Vocal Quality;Throat Clearing - Immediate;Cough - Immediate    Nectar Thick Nectar Thick Liquid: Within functional limits Presentation: Cup   Honey Thick Honey Thick Liquid: Not tested   Puree Puree: Impaired Presentation: Spoon Oral Phase Impairments: Reduced lingual movement/coordination  Oral Phase Functional Implications: Prolonged oral transit(likely due to lethargy)   Solid     Solid: Not tested      Malvina Schadler, MA, CCC-SLP 10/04/2018,9:46 AM

## 2018-10-04 NOTE — Progress Notes (Signed)
   Subjective: 4 Days Post-Op Procedure(s) (LRB): INTRAMEDULLARY (IM) NAIL INTERTROCHANTRIC (Left) CLOSED REDUCTION WRIST (Left) Patient reports pain as moderate.   Patient is well, and has had no acute complaints or problems Denies any CP, SOB, ABD pain. We will continue physical therapy today.   Objective: Vital signs in last 24 hours: Temp:  [97.5 F (36.4 C)-98.5 F (36.9 C)] 97.5 F (36.4 C) (06/22 0819) Pulse Rate:  [69-105] 105 (06/22 0819) Resp:  [18-20] 20 (06/22 0819) BP: (115-179)/(34-65) 179/65 (06/22 0819) SpO2:  [88 %-100 %] 96 % (06/22 0819)  Intake/Output from previous day: 06/21 0701 - 06/22 0700 In: 300 [P.O.:300] Out: 2650 [Urine:2650] Intake/Output this shift: No intake/output data recorded.  Recent Labs    10/01/18 0922 10/02/18 0620 10/03/18 0830  HGB 8.0* 7.6* 9.6*   Recent Labs    10/01/18 0922 10/02/18 0620 10/03/18 0830  WBC 20.6* 23.5*  --   RBC 2.48* 2.41*  --   HCT 24.3* 23.7* 28.2*  PLT 257 307  --    Recent Labs    10/02/18 0620 10/03/18 0630  NA 140 140  K 4.7 4.9  CL 105 107  CO2 23 23  BUN 78* 89*  CREATININE 4.05* 3.45*  GLUCOSE 157* 147*  CALCIUM 8.2* 8.3*   No results for input(s): LABPT, INR in the last 72 hours.  EXAM General - Patient is Alert, Appropriate and Oriented Left lower extremity - Neurovascular intact Sensation intact distally Intact pulses distally Dorsiflexion/Plantar flexion intact No cellulitis present Compartment soft Dressing - dressing C/D/I and no drainage  Left upper extremity -cast intact, minimal ecchymosis along the thumb.  No significant swelling throughout the digits. Motor Function - intact, moving foot and toes well on exam.   Past Medical History:  Diagnosis Date  . CHF (congestive heart failure) ([place])   . Depression   . GERD (gastroesophageal reflux disease)   . Hyperlipidemia   . Hypertension   . Thyroid disease     Assessment/Plan:   4 Days Post-Op Procedure(s)  (LRB): INTRAMEDULLARY (IM) NAIL INTERTROCHANTRIC (Left) CLOSED REDUCTION WRIST (Left) Active Problems:   Intertrochanteric fracture, closed, left, initial encounter ([place])   Fall  Estimated body mass index is 15.36 kg/m as calculated from the following:   Height as of this encounter: 5\' 2"  (1.575 m).   Weight as of this encounter: 38.1 kg. Advance diet Up with therapy  Acute post op blood loss anemia -hemoglobin 9.6 stable. Trending up.  The patient received 1 unit of transfused packed red blood cells 10/02/2018.  Continue iron supplementation.   Follow up with [place] [facility] in 2 weeks for xrays of left wrist and left hip staple removal and steri strip application   DVT Prophylaxis - TED hose and SCDs, Eliquis Weight-Bearing as tolerated to left lower extremity, nonweightbearing left upper extremity  Duanne Guess, PA-C [place] 10/04/2018, 8:35 AM

## 2022-05-15 DEATH — deceased
